# Patient Record
Sex: Male | Born: 1980 | Race: White | Hispanic: No | Marital: Married | Smoking: Current every day smoker
Health system: Southern US, Community
[De-identification: ages and names within clinical notes are randomized; demographics above are authoritative.]

## PROBLEM LIST (undated history)

## (undated) DIAGNOSIS — G43909 Migraine, unspecified, not intractable, without status migrainosus: Secondary | ICD-10-CM

## (undated) DIAGNOSIS — K029 Dental caries, unspecified: Secondary | ICD-10-CM

## (undated) HISTORY — PX: APPENDECTOMY: SHX54

---

## 2001-05-22 ENCOUNTER — Emergency Department (HOSPITAL_COMMUNITY): Admission: EM | Admit: 2001-05-22 | Discharge: 2001-05-22 | Payer: Self-pay | Admitting: Physical Therapy

## 2002-11-08 ENCOUNTER — Emergency Department (HOSPITAL_COMMUNITY): Admission: EM | Admit: 2002-11-08 | Discharge: 2002-11-08 | Payer: Self-pay | Admitting: Emergency Medicine

## 2003-07-02 ENCOUNTER — Emergency Department (HOSPITAL_COMMUNITY): Admission: EM | Admit: 2003-07-02 | Discharge: 2003-07-03 | Payer: Self-pay | Admitting: Emergency Medicine

## 2003-12-12 ENCOUNTER — Emergency Department (HOSPITAL_COMMUNITY): Admission: EM | Admit: 2003-12-12 | Discharge: 2003-12-13 | Payer: Self-pay | Admitting: Emergency Medicine

## 2005-06-19 ENCOUNTER — Emergency Department (HOSPITAL_COMMUNITY): Admission: EM | Admit: 2005-06-19 | Discharge: 2005-06-20 | Payer: Self-pay | Admitting: Emergency Medicine

## 2005-06-19 ENCOUNTER — Emergency Department (HOSPITAL_COMMUNITY): Admission: EM | Admit: 2005-06-19 | Discharge: 2005-06-19 | Payer: Self-pay | Admitting: Emergency Medicine

## 2005-10-21 ENCOUNTER — Emergency Department (HOSPITAL_COMMUNITY): Admission: EM | Admit: 2005-10-21 | Discharge: 2005-10-21 | Payer: Self-pay | Admitting: Emergency Medicine

## 2005-12-08 ENCOUNTER — Emergency Department (HOSPITAL_COMMUNITY): Admission: EM | Admit: 2005-12-08 | Discharge: 2005-12-08 | Payer: Self-pay | Admitting: Emergency Medicine

## 2005-12-24 ENCOUNTER — Emergency Department (HOSPITAL_COMMUNITY): Admission: EM | Admit: 2005-12-24 | Discharge: 2005-12-24 | Payer: Self-pay | Admitting: Emergency Medicine

## 2006-01-18 ENCOUNTER — Emergency Department (HOSPITAL_COMMUNITY): Admission: EM | Admit: 2006-01-18 | Discharge: 2006-01-18 | Payer: Self-pay | Admitting: Emergency Medicine

## 2006-01-25 ENCOUNTER — Ambulatory Visit: Payer: Self-pay | Admitting: Orthopedic Surgery

## 2006-03-01 ENCOUNTER — Ambulatory Visit: Payer: Self-pay | Admitting: Orthopedic Surgery

## 2007-10-23 ENCOUNTER — Emergency Department (HOSPITAL_COMMUNITY): Admission: EM | Admit: 2007-10-23 | Discharge: 2007-10-23 | Payer: Self-pay | Admitting: Emergency Medicine

## 2007-10-25 ENCOUNTER — Emergency Department (HOSPITAL_COMMUNITY): Admission: EM | Admit: 2007-10-25 | Discharge: 2007-10-25 | Payer: Self-pay | Admitting: Emergency Medicine

## 2008-06-04 ENCOUNTER — Emergency Department (HOSPITAL_COMMUNITY): Admission: EM | Admit: 2008-06-04 | Discharge: 2008-06-04 | Payer: Self-pay | Admitting: Emergency Medicine

## 2009-03-10 ENCOUNTER — Emergency Department (HOSPITAL_COMMUNITY): Admission: EM | Admit: 2009-03-10 | Discharge: 2009-03-10 | Payer: Self-pay | Admitting: Emergency Medicine

## 2009-04-28 ENCOUNTER — Emergency Department (HOSPITAL_COMMUNITY): Admission: EM | Admit: 2009-04-28 | Discharge: 2009-04-28 | Payer: Self-pay | Admitting: Emergency Medicine

## 2009-09-29 ENCOUNTER — Emergency Department (HOSPITAL_COMMUNITY): Admission: EM | Admit: 2009-09-29 | Discharge: 2009-09-29 | Payer: Self-pay | Admitting: Emergency Medicine

## 2010-07-21 ENCOUNTER — Emergency Department (HOSPITAL_COMMUNITY): Payer: Self-pay

## 2010-07-21 ENCOUNTER — Emergency Department (HOSPITAL_COMMUNITY)
Admission: EM | Admit: 2010-07-21 | Discharge: 2010-07-21 | Disposition: A | Discharge status: Home / Self Care | Payer: Self-pay | Attending: Emergency Medicine | Admitting: Emergency Medicine

## 2010-07-21 DIAGNOSIS — S60229A Contusion of unspecified hand, initial encounter: Secondary | ICD-10-CM | POA: Insufficient documentation

## 2010-07-21 DIAGNOSIS — M7989 Other specified soft tissue disorders: Secondary | ICD-10-CM | POA: Insufficient documentation

## 2010-07-21 DIAGNOSIS — W208XXA Other cause of strike by thrown, projected or falling object, initial encounter: Secondary | ICD-10-CM | POA: Insufficient documentation

## 2010-11-04 ENCOUNTER — Emergency Department (HOSPITAL_COMMUNITY): Payer: Self-pay

## 2010-11-04 ENCOUNTER — Emergency Department (HOSPITAL_COMMUNITY)
Admission: EM | Admit: 2010-11-04 | Discharge: 2010-11-04 | Disposition: A | Discharge status: Home / Self Care | Payer: Self-pay | Attending: Emergency Medicine | Admitting: Emergency Medicine

## 2010-11-04 DIAGNOSIS — R197 Diarrhea, unspecified: Secondary | ICD-10-CM | POA: Insufficient documentation

## 2010-11-04 DIAGNOSIS — R1013 Epigastric pain: Secondary | ICD-10-CM | POA: Insufficient documentation

## 2010-11-04 DIAGNOSIS — E860 Dehydration: Secondary | ICD-10-CM | POA: Insufficient documentation

## 2010-11-04 DIAGNOSIS — R112 Nausea with vomiting, unspecified: Secondary | ICD-10-CM | POA: Insufficient documentation

## 2010-11-04 DIAGNOSIS — R109 Unspecified abdominal pain: Secondary | ICD-10-CM | POA: Insufficient documentation

## 2010-11-04 DIAGNOSIS — R1033 Periumbilical pain: Secondary | ICD-10-CM | POA: Insufficient documentation

## 2010-11-04 LAB — URINALYSIS, ROUTINE W REFLEX MICROSCOPIC
Bilirubin Urine: NEGATIVE
Glucose, UA: NEGATIVE mg/dL
Hgb urine dipstick: NEGATIVE
Specific Gravity, Urine: 1.034 — ABNORMAL HIGH (ref 1.005–1.030)
Urobilinogen, UA: 0.2 mg/dL (ref 0.0–1.0)
pH: 6.5 (ref 5.0–8.0)

## 2010-11-04 LAB — COMPREHENSIVE METABOLIC PANEL
AST: 44 U/L — ABNORMAL HIGH (ref 0–37)
Alkaline Phosphatase: 93 U/L (ref 39–117)
CO2: 32 mEq/L (ref 19–32)
Calcium: 10.6 mg/dL — ABNORMAL HIGH (ref 8.4–10.5)
GFR calc non Af Amer: >60 mL/min (ref >60)
Glucose, Bld: 125 mg/dL — ABNORMAL HIGH (ref 70–99)
Total Bilirubin: 0.9 mg/dL (ref 0.3–1.2)
Total Protein: 9.5 g/dL — ABNORMAL HIGH (ref 6.0–8.3)

## 2010-11-04 LAB — LIPASE, BLOOD: Lipase: 40 U/L (ref 11–59)

## 2010-11-04 LAB — DIFFERENTIAL
Basophils Absolute: 0 10*3/uL (ref 0.0–0.1)
Basophils Relative: 0 % (ref 0–1)
Eosinophils Absolute: 0 10*3/uL (ref 0.0–0.7)
Eosinophils Relative: 0 % (ref 0–5)
Lymphs Abs: 2.2 10*3/uL (ref 0.7–4.0)
Monocytes Absolute: 1.3 10*3/uL — ABNORMAL HIGH (ref 0.1–1.0)

## 2010-11-04 LAB — CBC
HCT: 42.1 % (ref 39.0–52.0)
Hemoglobin: 14.5 g/dL (ref 13.0–17.0)
MCHC: 34.4 g/dL (ref 30.0–36.0)
MCV: 91.7 fL (ref 78.0–100.0)
RBC: 4.59 MIL/uL (ref 4.22–5.81)
WBC: 16.4 10*3/uL — ABNORMAL HIGH (ref 4.0–10.5)

## 2010-11-04 LAB — URINE MICROSCOPIC-ADD ON

## 2010-11-05 ENCOUNTER — Emergency Department (HOSPITAL_COMMUNITY): Payer: Self-pay

## 2010-11-05 ENCOUNTER — Emergency Department (HOSPITAL_COMMUNITY)
Admission: EM | Admit: 2010-11-05 | Discharge: 2010-11-06 | Disposition: A | Discharge status: Home / Self Care | Payer: Self-pay | Attending: Emergency Medicine | Admitting: Emergency Medicine

## 2010-11-05 DIAGNOSIS — R64 Cachexia: Secondary | ICD-10-CM | POA: Insufficient documentation

## 2010-11-05 DIAGNOSIS — R112 Nausea with vomiting, unspecified: Secondary | ICD-10-CM | POA: Insufficient documentation

## 2010-11-05 DIAGNOSIS — E876 Hypokalemia: Secondary | ICD-10-CM | POA: Insufficient documentation

## 2010-11-05 DIAGNOSIS — R10819 Abdominal tenderness, unspecified site: Secondary | ICD-10-CM | POA: Insufficient documentation

## 2010-11-05 DIAGNOSIS — F29 Unspecified psychosis not due to a substance or known physiological condition: Secondary | ICD-10-CM | POA: Insufficient documentation

## 2010-11-05 DIAGNOSIS — R062 Wheezing: Secondary | ICD-10-CM | POA: Insufficient documentation

## 2010-11-05 DIAGNOSIS — R197 Diarrhea, unspecified: Secondary | ICD-10-CM | POA: Insufficient documentation

## 2010-11-05 DIAGNOSIS — R109 Unspecified abdominal pain: Secondary | ICD-10-CM | POA: Insufficient documentation

## 2010-11-05 DIAGNOSIS — T426X5A Adverse effect of other antiepileptic and sedative-hypnotic drugs, initial encounter: Secondary | ICD-10-CM | POA: Insufficient documentation

## 2010-11-05 LAB — URINALYSIS, ROUTINE W REFLEX MICROSCOPIC
Glucose, UA: NEGATIVE mg/dL
Ketones, ur: 40 mg/dL — AB
Specific Gravity, Urine: 1.035 — ABNORMAL HIGH (ref 1.005–1.030)

## 2010-11-05 LAB — DIFFERENTIAL
Basophils Absolute: 0 10*3/uL (ref 0.0–0.1)
Basophils Relative: 0 % (ref 0–1)
Eosinophils Absolute: 0 10*3/uL (ref 0.0–0.7)
Lymphs Abs: 3.1 10*3/uL (ref 0.7–4.0)
Monocytes Absolute: 1.2 10*3/uL — ABNORMAL HIGH (ref 0.1–1.0)
Neutro Abs: 9.2 10*3/uL — ABNORMAL HIGH (ref 1.7–7.7)

## 2010-11-05 LAB — COMPREHENSIVE METABOLIC PANEL
ALT: 26 U/L (ref 0–53)
AST: 31 U/L (ref 0–37)
Albumin: 4.3 g/dL (ref 3.5–5.2)
CO2: 28 mEq/L (ref 19–32)
Calcium: 9.5 mg/dL (ref 8.4–10.5)
Creatinine, Ser: 0.54 mg/dL (ref 0.50–1.35)
GFR calc non Af Amer: >60 mL/min (ref >60)
Potassium: 3.1 mEq/L — ABNORMAL LOW (ref 3.5–5.1)
Total Bilirubin: 0.9 mg/dL (ref 0.3–1.2)
Total Protein: 7.7 g/dL (ref 6.0–8.3)

## 2010-11-05 LAB — CBC
MCV: 91.5 fL (ref 78.0–100.0)
RBC: 4.7 MIL/uL (ref 4.22–5.81)
RDW: 12.3 % (ref 11.5–15.5)
WBC: 13.5 10*3/uL — ABNORMAL HIGH (ref 4.0–10.5)

## 2010-11-05 LAB — URINE MICROSCOPIC-ADD ON

## 2010-11-05 LAB — RAPID URINE DRUG SCREEN, HOSP PERFORMED
Amphetamines: NOT DETECTED
Barbiturates: NOT DETECTED
Opiates: NOT DETECTED
Tetrahydrocannabinol: POSITIVE — AB

## 2010-11-05 LAB — ETHANOL: Alcohol, Ethyl (B): <11 mg/dL (ref 0–11)

## 2010-11-05 MED ORDER — IOHEXOL 300 MG/ML  SOLN
100.0000 mL | Freq: Once | INTRAMUSCULAR | Status: AC | PRN
  Administered 2010-11-05: 100 mL via INTRAVENOUS

## 2011-01-14 ENCOUNTER — Emergency Department (HOSPITAL_COMMUNITY)
Admission: EM | Admit: 2011-01-14 | Discharge: 2011-01-14 | Disposition: A | Discharge status: Home / Self Care | Payer: Self-pay | Attending: Emergency Medicine | Admitting: Emergency Medicine

## 2011-01-14 ENCOUNTER — Emergency Department (HOSPITAL_COMMUNITY): Payer: Self-pay

## 2011-01-14 DIAGNOSIS — IMO0002 Reserved for concepts with insufficient information to code with codable children: Secondary | ICD-10-CM | POA: Insufficient documentation

## 2011-01-14 DIAGNOSIS — R569 Unspecified convulsions: Secondary | ICD-10-CM | POA: Insufficient documentation

## 2011-01-14 DIAGNOSIS — W1809XA Striking against other object with subsequent fall, initial encounter: Secondary | ICD-10-CM | POA: Insufficient documentation

## 2011-01-14 DIAGNOSIS — F29 Unspecified psychosis not due to a substance or known physiological condition: Secondary | ICD-10-CM | POA: Insufficient documentation

## 2011-01-14 LAB — COMPREHENSIVE METABOLIC PANEL
BUN: 11 mg/dL (ref 6–23)
CO2: 22 mEq/L (ref 19–32)
Calcium: 9.2 mg/dL (ref 8.4–10.5)
Creatinine, Ser: 0.6 mg/dL (ref 0.50–1.35)
GFR calc Af Amer: >90 mL/min (ref >90)
GFR calc non Af Amer: >90 mL/min (ref >90)
Glucose, Bld: 108 mg/dL — ABNORMAL HIGH (ref 70–99)

## 2011-01-14 LAB — CBC
Hemoglobin: 14.3 g/dL (ref 13.0–17.0)
MCH: 33 pg (ref 26.0–34.0)
Platelets: 179 10*3/uL (ref 150–400)
WBC: 16.6 10*3/uL — ABNORMAL HIGH (ref 4.0–10.5)

## 2011-01-14 LAB — RAPID URINE DRUG SCREEN, HOSP PERFORMED
Cocaine: NOT DETECTED
Opiates: POSITIVE — AB

## 2011-01-14 LAB — DIFFERENTIAL
Basophils Absolute: 0 10*3/uL (ref 0.0–0.1)
Basophils Relative: 0 % (ref 0–1)
Eosinophils Relative: 0 % (ref 0–5)
Lymphocytes Relative: 13 % (ref 12–46)
Monocytes Absolute: 1 10*3/uL (ref 0.1–1.0)
Neutro Abs: 13.4 10*3/uL — ABNORMAL HIGH (ref 1.7–7.7)

## 2011-01-14 LAB — ETHANOL: Alcohol, Ethyl (B): <11 mg/dL (ref 0–11)

## 2011-07-21 ENCOUNTER — Encounter (HOSPITAL_COMMUNITY): Payer: Self-pay | Admitting: Emergency Medicine

## 2011-07-21 ENCOUNTER — Emergency Department (HOSPITAL_COMMUNITY)
Admission: EM | Admit: 2011-07-21 | Discharge: 2011-07-21 | Disposition: A | Discharge status: Home / Self Care | Payer: Self-pay | Attending: Emergency Medicine | Admitting: Emergency Medicine

## 2011-07-21 DIAGNOSIS — F17200 Nicotine dependence, unspecified, uncomplicated: Secondary | ICD-10-CM | POA: Insufficient documentation

## 2011-07-21 DIAGNOSIS — K0889 Other specified disorders of teeth and supporting structures: Secondary | ICD-10-CM

## 2011-07-21 DIAGNOSIS — K029 Dental caries, unspecified: Secondary | ICD-10-CM | POA: Insufficient documentation

## 2011-07-21 MED ORDER — PENICILLIN V POTASSIUM 500 MG PO TABS: 500.0000 mg | ORAL_TABLET | Freq: Three times a day (TID) | ORAL | Status: AC

## 2011-07-21 MED ORDER — HYDROCODONE-ACETAMINOPHEN 5-325 MG PO TABS: ORAL_TABLET | ORAL | Status: AC

## 2011-07-21 MED ORDER — NAPROXEN 500 MG PO TABS: 500.0000 mg | ORAL_TABLET | Freq: Two times a day (BID) | ORAL | Status: DC

## 2011-07-21 NOTE — ED Provider Notes (Signed)
History     CSN: [id number]  Arrival date & time 07/21/11  4098   First MD Initiated Contact with Patient 07/21/11 [phone]      Chief Complaint  Patient presents with  . Dental Pain  . Dental Problem    (Consider location/radiation/quality/duration/timing/severity/associated sxs/prior treatment) HPI Comments: 31 yo male presents to the ER this morning for tooth pain in the area of the R mandibular bicuspid.  Patient has a long history of dental problems and states that this time he noticed swelling and pain along the right gumline between his teeth and cheek for the last few weeks.  A few days ago the swelling came to a white head so the patient drained the area of pus.  The swelling and pain have continued.  He has had multiple broken teeth in the area, but denies recent injury or loss of teeth in that area.  Does not have a dentist and has yet to try anything alleviate the problem. Denies fever, headache, n/v, facial/throat swelling or redness. No neck swelling or trouble breathing.   Patient is a 31 y.o. male presenting with tooth pain. The history is provided by the patient.  Dental PainThe primary symptoms include mouth pain. Primary symptoms do not include dental injury, headaches, fever, shortness of breath or sore throat. The symptoms began more than 1 week ago. The symptoms are unchanged. The symptoms are chronic.  Additional symptoms include: gum swelling and gum tenderness. Additional symptoms do not include: facial swelling, trouble swallowing, ear pain and swollen glands. Medical issues include: smoking and periodontal disease. Medical issues do not include: chewing tobacco and immunosuppression.    History reviewed. No pertinent past medical history.  Past Surgical History  Procedure Date  . Appendectomy     History reviewed. No pertinent family history.  History  Substance Use Topics  . Smoking status: Current Everyday Smoker    Types: Cigarettes  . Smokeless tobacco:  Not on file  . Alcohol Use: No      Review of Systems  Constitutional: Negative for fever.  HENT: Positive for dental problem. Negative for ear pain, sore throat, facial swelling, trouble swallowing and neck pain.   Respiratory: Negative for shortness of breath and stridor.   Skin: Negative for color change.  Neurological: Negative for headaches.    Allergies  Phenergan  Home Medications  No current outpatient prescriptions on file.  BP 130/74  Pulse 71  Temp(Src) 98.7 F (37.1 C) (Oral)  Ht 5\' 6"  (1.676 m)  Wt 110 lb (49.896 kg)  BMI 17.75 kg/m2  SpO2 100%  Physical Exam  Nursing note and vitals reviewed. Constitutional: He is oriented to person, place, and time. He appears well-developed and well-nourished.  HENT:  Head: Normocephalic and atraumatic. No trismus in the jaw.  Right Ear: Tympanic membrane, external ear and ear canal normal.  Left Ear: Tympanic membrane, external ear and ear canal normal.  Nose: Nose normal.  Mouth/Throat: Uvula is midline, oropharynx is clear and moist and mucous membranes are normal. Abnormal dentition. Dental caries present. No dental abscesses or uvula swelling. No tonsillar abscesses.       Patient with R mandibular tooth pain and tenderness to palpation in area of bicuspid. No swelling or erythema noted on exam.  Eyes: Pupils are equal, round, and reactive to light.  Neck: Normal range of motion. Neck supple.       No neck swelling or Lugwig's angina  Cardiovascular: Normal rate, regular rhythm and normal heart sounds.  Pulmonary/Chest: Effort normal and breath sounds normal. No respiratory distress.  Lymphadenopathy:    He has no cervical adenopathy.  Neurological: He is alert and oriented to person, place, and time.  Skin: Skin is warm and dry.  Psychiatric: He has a normal mood and affect.    ED Course  Procedures (including critical care time)  Labs Reviewed - No data to display No results found.   1. Pain, dental       6:46 AM Patient seen and examined.    Vital signs reviewed and are as follows: Filed Vitals:   07/21/11 0338  BP: 130/74  Pulse: 71  Temp: 98.7 F (37.1 C)   Patient counseled to take prescribed medications as directed, return with worsening facial or neck swelling, and to follow-up with his dentist as soon as possible.   Patient counseled on use of narcotic pain medications. Counseled not to combine these medications with others containing tylenol. Urged not to drink alcohol, drive, or perform any other activities that requires focus while taking these medications. The patient verbalizes understanding and agrees with the plan.  MDM  Patient with toothache.  No gross abscess.  Exam unconcerning for Ludwig's angina or other deep tissue infection in neck.  Will treat with penicillin and pain medicine.  Urged patient to follow-up with dentist.           Renne Crigler, PA 07/21/11 [phone]

## 2011-07-21 NOTE — ED Notes (Signed)
Pt presented to the ER with c/ojaw pain secondary to dental issue, pt rerpots that his teeth are with decay and that he noted puss from the gums when pushed around, to the right side, bottom.

## 2011-07-21 NOTE — Discharge Instructions (Signed)
Please read and follow all provided instructions.  Your diagnoses today include:  1. Pain, dental     Tests performed today include:  Vital signs. See below for your results today.   Medications prescribed:   Vicodin (hydrocodone/acetaminophen) - narcotic pain medication  You have been prescribed narcotic pain medication such as Vicodin, Percocet, or Ultram: DO NOT drive or perform any activities that require you to be awake and alert because this medicine can make you drowsy. BE VERY CAREFUL not to take multiple medicines containing Tylenol (also called acetaminophen). Doing so can lead to an overdose which can damage your liver and cause liver failure and possibly death.    Naproxen - anti-inflammatory pain medication  Do not exceed 500mg  naproxen every 12 hours  You have been prescribed an anti-inflammatory medication or NSAID. Take with food. Take smallest effective dose for the shortest duration needed for your pain. Stop taking if you experience stomach pain or vomiting.    Penicillin - antibiotic for dental infection  You have been prescribed an antibiotic medicine: take the entire course of medicine even if you are feeling better. Stopping early can cause the antibiotic not to work.  Take any prescribed medications only as directed.  Home care instructions:  Follow any educational materials contained in this packet.  Follow-up instructions: Please follow-up with your dentist for further evaluation of your symptoms. Call the referral below for an appointment. Tell them you were seen in the emergency department.  If you do not have a dentist or primary care doctor -- see below for referral information.   The exam and treatment you received today has been provided on an emergency basis only. This is not a substitute for complete medical or dental care. If your problem worsens or new symptoms (problems) appear, and you are unable to arrange prompt follow-up care with your  dentist, return to this location.  Return instructions:   Please return to the Emergency Department if you experience worsening symptoms.  Please return if you develop a fever, you develop more swelling in your face or neck, you have trouble breathing or swallowing food.  Please return if you have any other emergent concerns.  Additional Information:  Your vital signs today were: BP 130/74  Pulse 71  Temp(Src) 98.7 F (37.1 C) (Oral)  Ht 5\' 6"  (1.676 m)  Wt 110 lb (49.896 kg)  BMI 17.75 kg/m2  SpO2 100% If your blood pressure (BP) was elevated above 135/85 this visit, please have this repeated by your doctor within one month. -------------- No Primary Care Doctor Call [facility]  [phone] Other agencies that provide inexpensive medical care    [facility]  [phone]    [facility]  [phone]    [facility]  [phone]    [facility]  [phone]    [facility]  [phone]    [facility]  [phone] -------------- RESOURCE GUIDE:  Dental Problems  Patients with Medicaid: [facility] [phone] [address]                                            [phone] [address] Phone:  [phone]  Phone:  [phone]  If unable to pay or uninsured, contact:  [facility] or [facility] to become qualified for the adult dental clinic.  Chronic Pain Problems Contact [facility]  [phone] Patients need to be referred by their primary care doctor.  Insufficient Money for Medicine Contact [employer]:  call "211" or [facility] 8136184987.  Psychological Services [facility]  [phone] [facility]  [phone] [facility]   [phone] (emergency services [phone])  Substance Abuse Resources [facility]  2497487519 [facility] [phone] [facility] [place] [phone] Floydene Flock [phone] Residential & Outpatient Substance Abuse Program  [phone]  Abuse/Neglect [place] Child Abuse Hotline (913)231-6650 [facility] Child Abuse Hotline 9516084707 (After Hours)  Emergency Shelter [facility] [phone]  Maternity Homes [facility] at [facility] [place] [facility] [phone] [place] [facility] [phone]  [facility] Resources  [facility]     [employer]                          [facility] Dept. [address]                        [address]      [address]  Blondell Reveal Phone:  [phone]                                   Phone:  [phone]                 Phone:  [phone]  [facility] Phone:  [phone]  [facility] Child Abuse Hotline [phone] 2026544740 (After Hours)

## 2011-07-23 NOTE — ED Provider Notes (Signed)
Medical screening examination/treatment/procedure(s) were performed by non-physician practitioner and as supervising physician I was immediately available for consultation/collaboration.  Lenda Baratta M Jeslie Lowe, MD 07/23/11 2139 

## 2011-10-21 ENCOUNTER — Emergency Department (HOSPITAL_COMMUNITY)
Admission: EM | Admit: 2011-10-21 | Discharge: 2011-10-21 | Disposition: A | Discharge status: Home / Self Care | Payer: Self-pay | Attending: Emergency Medicine | Admitting: Emergency Medicine

## 2011-10-21 ENCOUNTER — Encounter (HOSPITAL_COMMUNITY): Payer: Self-pay | Admitting: Emergency Medicine

## 2011-10-21 DIAGNOSIS — K029 Dental caries, unspecified: Secondary | ICD-10-CM | POA: Insufficient documentation

## 2011-10-21 DIAGNOSIS — F17200 Nicotine dependence, unspecified, uncomplicated: Secondary | ICD-10-CM | POA: Insufficient documentation

## 2011-10-21 DIAGNOSIS — K089 Disorder of teeth and supporting structures, unspecified: Secondary | ICD-10-CM | POA: Insufficient documentation

## 2011-10-21 DIAGNOSIS — K0889 Other specified disorders of teeth and supporting structures: Secondary | ICD-10-CM

## 2011-10-21 MED ORDER — NAPROXEN 500 MG PO TABS: 500.0000 mg | ORAL_TABLET | Freq: Two times a day (BID) | ORAL | Status: DC

## 2011-10-21 MED ORDER — HYDROCODONE-ACETAMINOPHEN 5-325 MG PO TABS: 2.0000 | ORAL_TABLET | ORAL | Status: AC | PRN

## 2011-10-21 MED ORDER — PENICILLIN V POTASSIUM 500 MG PO TABS: 500.0000 mg | ORAL_TABLET | Freq: Four times a day (QID) | ORAL | Status: AC

## 2011-10-21 NOTE — ED Notes (Signed)
Pt verbalizes understanding 

## 2011-10-21 NOTE — ED Notes (Signed)
Presents w/ painful left lower jaw, ear pain, and headache. Obvious multiple dental caries entire oral cavity

## 2011-10-21 NOTE — ED Provider Notes (Signed)
History     CSN: [id number]  Arrival date & time 10/21/11  1619   First MD Initiated Contact with Patient 10/21/11 1932      Chief Complaint  Patient presents with  . Dental Pain    c/o pain left lower jaw line, all dentation w/ obvious caries    (Consider location/radiation/quality/duration/timing/severity/associated sxs/prior treatment) Patient is a 31 y.o. male presenting with tooth pain. The history is provided by the patient. No language interpreter was used.  Dental PainThe primary symptoms include mouth pain. Primary symptoms do not include fever, sore throat or angioedema. The symptoms began more than 1 month ago. The symptoms are worsening. The symptoms are recurrent. The symptoms occur intermittently.  Additional symptoms include: dental sensitivity to temperature, gum swelling, gum tenderness, jaw pain and ear pain. Additional symptoms do not include: purulent gums, trismus, facial swelling, trouble swallowing, drooling, hearing loss and swollen glands. Medical issues include: smoking. Medical issues do not include: alcohol problem.   Patient was here  April 1 with the same complaint that his right lower incisor tooth pain with inflamed gums and swelling. States That he cannot afford to have the tooth that has broken off under his gum removed by an oral Careers adviser. He has 2 other decayed teeth on either side. Pain 10 out of 10. States he has  taken Tylenol for pain with no relief.    History reviewed. No pertinent past medical history.  Past Surgical History  Procedure Date  . Appendectomy     No family history on file.  History  Substance Use Topics  . Smoking status: Current Everyday Smoker -- 1.0 packs/day    Types: Cigarettes  . Smokeless tobacco: Never Used  . Alcohol Use: No      Review of Systems  Constitutional: Negative.  Negative for fever.  HENT: Positive for ear pain and dental problem. Negative for hearing loss, sore throat, facial swelling, drooling,  trouble swallowing, neck pain and ear discharge.   Eyes: Negative.   Respiratory: Negative.   Cardiovascular: Negative.   Gastrointestinal: Negative.   Neurological: Negative.   Psychiatric/Behavioral: Negative.   All other systems reviewed and are negative.    Allergies  Promethazine hcl  Home Medications   Current Outpatient Rx  Name Route Sig Dispense Refill  . NAPROXEN 500 MG PO TABS Oral Take 1 tablet (500 mg total) by mouth 2 (two) times daily. 20 tablet 0    BP 114/78  Pulse 67  Temp 98 F (36.7 C) (Oral)  SpO2 100%  Physical Exam  Nursing note and vitals reviewed. Constitutional: He is oriented to person, place, and time. He appears well-developed and well-nourished.  HENT:  Head: Normocephalic.  Mouth/Throat:    Eyes: Conjunctivae and EOM are normal. Pupils are equal, round, and reactive to light.  Neck: Normal range of motion. Neck supple.  Cardiovascular: Normal rate.   Pulmonary/Chest: Effort normal.  Abdominal: Soft.  Musculoskeletal: Normal range of motion.  Neurological: He is alert and oriented to person, place, and time.  Skin: Skin is warm and dry.  Psychiatric: He has a normal mood and affect.    ED Course  Procedures (including critical care time)  Labs Reviewed - No data to display No results found.   No diagnosis found.    MDM  Tooth pain with multiple caries.  Will call dentist on Monday for appointment.  Dentist will recommend a oral surgeon for 1 of the teeth.  Rx for naprosyn, pcn, and vicodin  Remi Haggard, NP 10/22/11 1401

## 2011-10-21 NOTE — ED Notes (Signed)
Pt reports right lower back tooth "infection"  Pt reports tooth is broken in gums

## 2011-10-25 NOTE — ED Provider Notes (Signed)
History/physical exam/procedure(s) were performed by non-physician practitioner and as supervising physician I was immediately available for consultation/collaboration. I have reviewed all notes and am in agreement with care and plan.   Gable Odonohue S Sanela Evola, MD 10/25/11 0757 

## 2012-04-15 ENCOUNTER — Emergency Department (HOSPITAL_COMMUNITY)
Admission: EM | Admit: 2012-04-15 | Discharge: 2012-04-15 | Disposition: A | Discharge status: Home / Self Care | Payer: Self-pay | Attending: Emergency Medicine | Admitting: Emergency Medicine

## 2012-04-15 ENCOUNTER — Encounter (HOSPITAL_COMMUNITY): Payer: Self-pay | Admitting: *Deleted

## 2012-04-15 DIAGNOSIS — F17200 Nicotine dependence, unspecified, uncomplicated: Secondary | ICD-10-CM | POA: Insufficient documentation

## 2012-04-15 DIAGNOSIS — K089 Disorder of teeth and supporting structures, unspecified: Secondary | ICD-10-CM | POA: Insufficient documentation

## 2012-04-15 DIAGNOSIS — K029 Dental caries, unspecified: Secondary | ICD-10-CM | POA: Insufficient documentation

## 2012-04-15 DIAGNOSIS — K0889 Other specified disorders of teeth and supporting structures: Secondary | ICD-10-CM

## 2012-04-15 MED ORDER — HYDROCODONE-ACETAMINOPHEN 5-325 MG PO TABS: 1.0000 | ORAL_TABLET | Freq: Four times a day (QID) | ORAL | Status: DC | PRN

## 2012-04-15 NOTE — ED Provider Notes (Signed)
History     CSN: [id number]  Arrival date & time 04/15/12  2952   First MD Initiated Contact with Patient 04/15/12 1044      Chief Complaint  Patient presents with  . Dental Pain    (Consider location/radiation/quality/duration/timing/severity/associated sxs/prior treatment) HPI Comments: 32 yo male presents to the ED complaining of dental pain.  Patient states that the pain in his upper left molar began this morning. The tooth intermittently bothers him normally, but it is worst he has ever experienced today.  Pain is sharp, shooting, and is radiating to the ear and head rated 9/10.  Pain is exacerbated by eating and drinking, and is slightly relieved by orajel and tylenol.  Patient does not see a dentist.  Patient denies fever, chills, and swelling of the face and jaw.     The history is provided by the patient. No language interpreter was used.    History reviewed. No pertinent past medical history.  Past Surgical History  Procedure Date  . Appendectomy     No family history on file.  History  Substance Use Topics  . Smoking status: Current Every Day Smoker -- 1.0 packs/day    Types: Cigarettes  . Smokeless tobacco: Never Used  . Alcohol Use: No      Review of Systems  Constitutional: Negative for fever and chills.  HENT: Positive for dental problem. Negative for ear pain, congestion, facial swelling, neck pain and ear discharge.   Respiratory: Negative for shortness of breath.   Cardiovascular: Negative for chest pain.  All other systems reviewed and are negative.    Allergies  Promethazine hcl  Home Medications   Current Outpatient Rx  Name  Route  Sig  Dispense  Refill  . NAPROXEN 500 MG PO TABS   Oral   Take 1 tablet (500 mg total) by mouth 2 (two) times daily.   20 tablet   0   . NAPROXEN 500 MG PO TABS   Oral   Take 1 tablet (500 mg total) by mouth 2 (two) times daily.   30 tablet   0     BP 149/70  Pulse 92  Temp 97.9 F (36.6 C)  (Oral)  Resp 16  Ht 5\' 6"  (1.676 m)  Wt 110 lb (49.896 kg)  BMI 17.75 kg/m2  SpO2 100%  Physical Exam  Nursing note and vitals reviewed. Constitutional: He is oriented to person, place, and time. He appears well-developed and well-nourished. No distress.  HENT:  Head: Normocephalic and atraumatic.  Right Ear: External ear normal.  Left Ear: External ear normal.  Nose: Nose normal.  Mouth/Throat: Uvula is midline, oropharynx is clear and moist and mucous membranes are normal. Abnormal dentition. Dental caries present. No oropharyngeal exudate, posterior oropharyngeal edema, posterior oropharyngeal erythema or tonsillar abscesses.       Poor dentition throughout, multiple eroded and missing teeth.  No evidence of dental abscess, no erythema, or swelling of the gums.  Generalized tenderness of the mouth.  Eyes: Conjunctivae normal are normal. Pupils are equal, round, and reactive to light.  Neck: Normal range of motion. Neck supple.  Cardiovascular: Normal rate, regular rhythm and normal heart sounds.  Exam reveals no gallop and no friction rub.   No murmur heard. Pulmonary/Chest: Effort normal and breath sounds normal. No respiratory distress. He has no wheezes. He has no rales. He exhibits no tenderness.  Musculoskeletal: Normal range of motion. He exhibits no edema.  Neurological: He is alert and oriented to person,  place, and time.  Skin: Skin is warm and dry. He is not diaphoretic.  Psychiatric: He has a normal mood and affect. His behavior is normal.    ED Course  Procedures (including critical care time)  Labs Reviewed - No data to display No results found.   1. Pain, dental   2. Poor dentition       MDM  32 y/o male with dental pain. Poor dentition noted throughout. No evidence of infection or abscess. Number for dentist given. Stressed importance of follow up with dentist. Rx vicodin #10. Return precautions discussed. Vitals normal. Patient states understanding of plan  and is agreeable.         Trevor Mace, PA-C 04/15/12 1159

## 2012-04-15 NOTE — Progress Notes (Signed)
  On 04/15/12 Pt listed with no insurance coverage and no pcp CM and [facility] liaison spoke with pt. Pt offered services to assist with finding a [place] self pay provider & health reform information

## 2012-04-15 NOTE — ED Notes (Signed)
Pt reports a L lower toothache-became severe this am.  Pt reports a broken tooth with "a hole in the middle."  No dentist.

## 2012-04-15 NOTE — Progress Notes (Signed)
Pt agreed to see Dr Edward Jolly or contact toll free number for dental providers offered by [facility] liaison

## 2012-04-18 NOTE — ED Provider Notes (Signed)
Medical screening examination/treatment/procedure(s) were performed by non-physician practitioner and as supervising physician I was immediately available for consultation/collaboration.  [name], MD 04/18/12 0753 

## 2012-08-13 ENCOUNTER — Emergency Department (HOSPITAL_COMMUNITY): Payer: Self-pay

## 2012-08-13 ENCOUNTER — Emergency Department (HOSPITAL_COMMUNITY)
Admission: EM | Admit: 2012-08-13 | Discharge: 2012-08-14 | Disposition: A | Discharge status: Home / Self Care | Payer: Self-pay | Attending: Emergency Medicine | Admitting: Emergency Medicine

## 2012-08-13 ENCOUNTER — Encounter (HOSPITAL_COMMUNITY): Payer: Self-pay | Admitting: Emergency Medicine

## 2012-08-13 DIAGNOSIS — S20212A Contusion of left front wall of thorax, initial encounter: Secondary | ICD-10-CM

## 2012-08-13 DIAGNOSIS — W010XXA Fall on same level from slipping, tripping and stumbling without subsequent striking against object, initial encounter: Secondary | ICD-10-CM | POA: Insufficient documentation

## 2012-08-13 DIAGNOSIS — Z87891 Personal history of nicotine dependence: Secondary | ICD-10-CM | POA: Insufficient documentation

## 2012-08-13 DIAGNOSIS — IMO0002 Reserved for concepts with insufficient information to code with codable children: Secondary | ICD-10-CM | POA: Insufficient documentation

## 2012-08-13 DIAGNOSIS — Y92009 Unspecified place in unspecified non-institutional (private) residence as the place of occurrence of the external cause: Secondary | ICD-10-CM | POA: Insufficient documentation

## 2012-08-13 DIAGNOSIS — Y9389 Activity, other specified: Secondary | ICD-10-CM | POA: Insufficient documentation

## 2012-08-13 DIAGNOSIS — S20219A Contusion of unspecified front wall of thorax, initial encounter: Secondary | ICD-10-CM | POA: Insufficient documentation

## 2012-08-13 MED ORDER — IBUPROFEN 800 MG PO TABS
800.0000 mg | ORAL_TABLET | Freq: Once | ORAL | Status: AC
  Administered 2012-08-14: 800 mg via ORAL
  Filled 2012-08-13: qty 1

## 2012-08-13 NOTE — ED Notes (Signed)
Pt states he was taking something out to his car and slipped and fell landing on the concrete on his left side  Pt states he is having pain to his rib area on that side  Pt is guarding his ribs

## 2012-08-14 MED ORDER — HYDROCODONE-ACETAMINOPHEN 5-325 MG PO TABS: 1.0000 | ORAL_TABLET | Freq: Four times a day (QID) | ORAL | Status: DC | PRN

## 2012-08-14 MED ORDER — HYDROMORPHONE HCL PF 1 MG/ML IJ SOLN
1.0000 mg | Freq: Once | INTRAMUSCULAR | Status: AC
  Administered 2012-08-14: 1 mg via INTRAMUSCULAR
  Filled 2012-08-14: qty 1

## 2012-08-14 MED ORDER — IBUPROFEN 800 MG PO TABS: 800.0000 mg | ORAL_TABLET | Freq: Three times a day (TID) | ORAL | Status: DC

## 2012-08-14 NOTE — ED Provider Notes (Signed)
History     CSN: [id number]  Arrival date & time 08/13/12  2317   First MD Initiated Contact with Patient 08/13/12 2355      Chief Complaint  Patient presents with  . Fall    (Consider location/radiation/quality/duration/timing/severity/associated sxs/prior treatment) HPI Hx per PT - around 9pm slipped at home outside, injured left ribs, sustained abrasion, denies any other pain/ injury/ trauma. Pain is sharp worse with deep inspiration. No ABD pain, no SOB otherwise. Pain mod to severe.  History reviewed. No pertinent past medical history.  Past Surgical History  Procedure Laterality Date  . Appendectomy      History reviewed. No pertinent family history.  History  Substance Use Topics  . Smoking status: Former Smoker -- 1.00 packs/day    Types: Cigarettes  . Smokeless tobacco: Never Used  . Alcohol Use: No      Review of Systems  Constitutional: Negative for fever and chills.  HENT: Negative for neck pain and neck stiffness.   Eyes: Negative for pain.  Respiratory: Negative for shortness of breath.   Cardiovascular: Positive for chest pain.  Gastrointestinal: Negative for vomiting and abdominal pain.  Genitourinary: Negative for dysuria.  Musculoskeletal: Negative for back pain.  Skin: Negative for rash.  Neurological: Negative for headaches.  All other systems reviewed and are negative.    Allergies  Promethazine hcl  Home Medications   Current Outpatient Rx  Name  Route  Sig  Dispense  Refill  . acetaminophen (TYLENOL) 500 MG tablet   Oral   Take 500 mg by mouth every 6 (six) hours as needed. pain         . HYDROcodone-acetaminophen (NORCO/VICODIN) 5-325 MG per tablet   Oral   Take 1-2 tablets by mouth every 6 (six) hours as needed for pain.   10 tablet   0   . HYDROcodone-acetaminophen (NORCO/VICODIN) 5-325 MG per tablet   Oral   Take 1 tablet by mouth every 6 (six) hours as needed for pain.   10 tablet   0   . ibuprofen (ADVIL,MOTRIN)  800 MG tablet   Oral   Take 1 tablet (800 mg total) by mouth 3 (three) times daily.   21 tablet   0     BP 123/67  Pulse 96  Temp(Src) 99.3 F (37.4 C) (Oral)  Resp 22  Ht 5\' 6"  (1.676 m)  Wt 110 lb (49.896 kg)  BMI 17.76 kg/m2  SpO2 98%  Physical Exam  Constitutional: He is oriented to person, place, and time. He appears well-developed and well-nourished.  HENT:  Head: Normocephalic and atraumatic.  Eyes: EOM are normal. Pupils are equal, round, and reactive to light.  Neck: Neck supple.  Cardiovascular: Normal rate, regular rhythm and intact distal pulses.   Pulmonary/Chest: Effort normal and breath sounds normal. No respiratory distress.  TTP left lateral ribs with abrasions, no crepitus. No LUQ tenderness or guarding  Abdominal: Soft. Bowel sounds are normal. He exhibits no distension. There is no tenderness. There is no rebound and no guarding.  Musculoskeletal: Normal range of motion. He exhibits no edema.  Neurological: He is alert and oriented to person, place, and time.  Skin: Skin is warm and dry.    ED Course  Procedures (including critical care time)  Labs Reviewed - No data to display Dg Ribs Unilateral W/chest Left  08/13/2012  *RADIOLOGY REPORT*  Clinical Data: Fall, left lateral rib pain.  LEFT RIBS AND CHEST - 3+ VIEW  Comparison: 11/04/2010  Findings:  Heart and mediastinal contours are within normal limits. No focal opacities or effusions.  No acute bony abnormality.  No visible rib fracture.  No pneumothorax.  IMPRESSION: Negative exam.   Original Report Authenticated By: Charlett Nose, M.D.      1. Contusion of ribs, left, initial encounter    Motrin, IM Dilaudid  Rib pain instructions/ precautions and RX provided. Outpatient referral as needed.   MDM  Fall, rib contusion, no PTX or Fx identified on CXR reviewed as above.   Incentive spirometer provided with instructions  VS and nursing notes reviewed  Pain addressed with NSAIDs/ IM  narcotics        Sunnie Nielsen, MD 08/14/12 [phone]

## 2012-08-17 ENCOUNTER — Emergency Department (HOSPITAL_COMMUNITY)
Admission: EM | Admit: 2012-08-17 | Discharge: 2012-08-17 | Disposition: A | Discharge status: Home / Self Care | Payer: Self-pay | Attending: Emergency Medicine | Admitting: Emergency Medicine

## 2012-08-17 ENCOUNTER — Encounter (HOSPITAL_COMMUNITY): Payer: Self-pay

## 2012-08-17 DIAGNOSIS — Z87891 Personal history of nicotine dependence: Secondary | ICD-10-CM | POA: Insufficient documentation

## 2012-08-17 DIAGNOSIS — R0781 Pleurodynia: Secondary | ICD-10-CM

## 2012-08-17 DIAGNOSIS — Y929 Unspecified place or not applicable: Secondary | ICD-10-CM | POA: Insufficient documentation

## 2012-08-17 DIAGNOSIS — IMO0002 Reserved for concepts with insufficient information to code with codable children: Secondary | ICD-10-CM | POA: Insufficient documentation

## 2012-08-17 DIAGNOSIS — W19XXXA Unspecified fall, initial encounter: Secondary | ICD-10-CM | POA: Insufficient documentation

## 2012-08-17 DIAGNOSIS — Y939 Activity, unspecified: Secondary | ICD-10-CM | POA: Insufficient documentation

## 2012-08-17 MED ORDER — OXYCODONE-ACETAMINOPHEN 5-325 MG PO TABS: 1.0000 | ORAL_TABLET | Freq: Four times a day (QID) | ORAL | Status: DC | PRN

## 2012-08-17 NOTE — ED Provider Notes (Signed)
History     CSN: [id number]  Arrival date & time 08/17/12  1519   First MD Initiated Contact with Patient 08/17/12 1618      Chief Complaint  Patient presents with  . Fall    He states he fell this Tues./persistent left lower ribs area pain.    (Consider location/radiation/quality/duration/timing/severity/associated sxs/prior treatment) HPI Comments: Patient reports that he fell four days ago and landed on his left rib area.  Pain has been constant in the left lower ribs since that time.  Pain worse with taking a deep breath, coughing, sneezing, and also palpation.  He was seen in the ED after the fall and had an xray of his left ribs at that time, which was negative.  He has been taking Norco for pain with mild relief.  He has also been using Incentive spirometry several times a day.  He denies SOB, fever, or abdominal pain.  He does have an occasional non productive cough.    Patient is a 32 y.o. male presenting with fall. The history is provided by the patient.  Fall Pertinent negatives include no fever, no abdominal pain, no nausea and no vomiting.    History reviewed. No pertinent past medical history.  Past Surgical History  Procedure Laterality Date  . Appendectomy      History reviewed. No pertinent family history.  History  Substance Use Topics  . Smoking status: Former Smoker -- 1.00 packs/day    Types: Cigarettes  . Smokeless tobacco: Never Used  . Alcohol Use: No      Review of Systems  Constitutional: Negative for fever and chills.  Respiratory: Negative for cough and shortness of breath.   Gastrointestinal: Negative for nausea, vomiting and abdominal pain.  Musculoskeletal:       Left rib pain  Neurological: Negative for dizziness, syncope and light-headedness.    Allergies  Promethazine hcl  Home Medications   Current Outpatient Rx  Name  Route  Sig  Dispense  Refill  . Aspirin-Caffeine 845-65 MG PACK   Oral   Take 1 packet by mouth 2 (two)  times daily as needed. For pain         . HYDROcodone-acetaminophen (NORCO/VICODIN) 5-325 MG per tablet   Oral   Take 1 tablet by mouth every 6 (six) hours as needed for pain.   10 tablet   0   . ibuprofen (ADVIL,MOTRIN) 800 MG tablet   Oral   Take 800 mg by mouth 3 (three) times daily as needed for pain.           BP 134/82  Pulse 54  Temp(Src) 99.1 F (37.3 C) (Oral)  Resp 16  Wt 107 lb 5 oz (48.677 kg)  BMI 17.33 kg/m2  SpO2 100%  Physical Exam  Nursing note and vitals reviewed. Constitutional: He appears well-developed and well-nourished.  HENT:  Head: Normocephalic and atraumatic.  Neck: Normal range of motion. Neck supple.  Cardiovascular: Normal rate, regular rhythm and normal heart sounds.   Pulmonary/Chest: Effort normal and breath sounds normal. No respiratory distress. He has no wheezes. He has no rales. He exhibits tenderness.  Tenderness to palpation of the left ribs  Abdominal: Soft. There is no tenderness. There is no rebound and no guarding.  Musculoskeletal: Normal range of motion.  Neurological: He is alert.  Skin: Skin is warm and dry.  Psychiatric: He has a normal mood and affect.    ED Course  Procedures (including critical care time)  Labs Reviewed -  No data to display No results found.   No diagnosis found.  Looked up patient in the Narcotic Database.  He does not have frequent narcotic prescriptions filled.  MDM  Patient presenting with persistent left rib pain that has been present after falling four days ago.  He was seen in the ED after the injury and had an xray of the ribs at that time, which was negative.  He was also discharged home with Incentive Spirometry.  Therefore, do not feel that further imaging is indicated at this time.  Patient discharged home with pain medication.  Return precautions given.          Pascal Lux [place], PA-C 08/18/12 1424

## 2012-08-17 NOTE — ED Notes (Signed)
He states he fell in his yard as he was stepping off of his deck while carrying a microwave oven; landing on his left side. Seen here at our E. D. For this and had x-ray.  He is here today with c/o persistent pain at left lower ribs/lat. Lower thorax area. He is in no distress.

## 2012-08-18 NOTE — ED Provider Notes (Signed)
Medical screening examination/treatment/procedure(s) were performed by non-physician practitioner and as supervising physician I was immediately available for consultation/collaboration.   Gavin Pound. Khristine Verno, MD 08/18/12 1426

## 2013-09-24 ENCOUNTER — Emergency Department (HOSPITAL_COMMUNITY): Payer: Self-pay

## 2013-09-24 ENCOUNTER — Emergency Department (HOSPITAL_COMMUNITY)
Admission: EM | Admit: 2013-09-24 | Discharge: 2013-09-24 | Disposition: A | Discharge status: Home / Self Care | Payer: Self-pay | Attending: Emergency Medicine | Admitting: Emergency Medicine

## 2013-09-24 ENCOUNTER — Encounter (HOSPITAL_COMMUNITY): Payer: Self-pay | Admitting: Emergency Medicine

## 2013-09-24 DIAGNOSIS — K007 Teething syndrome: Secondary | ICD-10-CM | POA: Insufficient documentation

## 2013-09-24 DIAGNOSIS — M2559 Pain in other specified joint: Secondary | ICD-10-CM | POA: Insufficient documentation

## 2013-09-24 DIAGNOSIS — R112 Nausea with vomiting, unspecified: Secondary | ICD-10-CM | POA: Insufficient documentation

## 2013-09-24 DIAGNOSIS — R630 Anorexia: Secondary | ICD-10-CM | POA: Insufficient documentation

## 2013-09-24 DIAGNOSIS — IMO0001 Reserved for inherently not codable concepts without codable children: Secondary | ICD-10-CM | POA: Insufficient documentation

## 2013-09-24 DIAGNOSIS — Z8679 Personal history of other diseases of the circulatory system: Secondary | ICD-10-CM | POA: Insufficient documentation

## 2013-09-24 DIAGNOSIS — R6883 Chills (without fever): Secondary | ICD-10-CM | POA: Insufficient documentation

## 2013-09-24 DIAGNOSIS — R51 Headache: Secondary | ICD-10-CM | POA: Insufficient documentation

## 2013-09-24 DIAGNOSIS — D72829 Elevated white blood cell count, unspecified: Secondary | ICD-10-CM | POA: Insufficient documentation

## 2013-09-24 DIAGNOSIS — M791 Myalgia, unspecified site: Secondary | ICD-10-CM

## 2013-09-24 DIAGNOSIS — R5383 Other fatigue: Secondary | ICD-10-CM

## 2013-09-24 DIAGNOSIS — R5381 Other malaise: Secondary | ICD-10-CM | POA: Insufficient documentation

## 2013-09-24 DIAGNOSIS — R197 Diarrhea, unspecified: Secondary | ICD-10-CM | POA: Insufficient documentation

## 2013-09-24 DIAGNOSIS — M2550 Pain in unspecified joint: Secondary | ICD-10-CM

## 2013-09-24 LAB — CBC WITH DIFFERENTIAL/PLATELET
BASOS ABS: 0 10*3/uL (ref 0.0–0.1)
Basophils Relative: 0 % (ref 0–1)
EOS PCT: 0 % (ref 0–5)
Eosinophils Absolute: 0 10*3/uL (ref 0.0–0.7)
HCT: 45.6 % (ref 39.0–52.0)
Hemoglobin: 15.9 g/dL (ref 13.0–17.0)
LYMPHS PCT: 15 % (ref 12–46)
Lymphs Abs: 2.1 10*3/uL (ref 0.7–4.0)
MCH: 31.1 pg (ref 26.0–34.0)
MCHC: 34.9 g/dL (ref 30.0–36.0)
MCV: 89.2 fL (ref 78.0–100.0)
Monocytes Absolute: 0.6 10*3/uL (ref 0.1–1.0)
Monocytes Relative: 4 % (ref 3–12)
NEUTROS ABS: 11.5 10*3/uL — AB (ref 1.7–7.7)
NEUTROS PCT: 81 % — AB (ref 43–77)
PLATELETS: 254 10*3/uL (ref 150–400)
RBC: 5.11 MIL/uL (ref 4.22–5.81)
RDW: 13.6 % (ref 11.5–15.5)
WBC: 14.2 10*3/uL — AB (ref 4.0–10.5)

## 2013-09-24 LAB — COMPREHENSIVE METABOLIC PANEL
ALT: 11 U/L (ref 0–53)
AST: 22 U/L (ref 0–37)
Albumin: 4.4 g/dL (ref 3.5–5.2)
Alkaline Phosphatase: 88 U/L (ref 39–117)
BUN: 12 mg/dL (ref 6–23)
CALCIUM: 9.5 mg/dL (ref 8.4–10.5)
CO2: 22 meq/L (ref 19–32)
Chloride: 99 mEq/L (ref 96–112)
Creatinine, Ser: 0.67 mg/dL (ref 0.50–1.35)
GFR calc Af Amer: >90 mL/min (ref >90)
Glucose, Bld: 139 mg/dL — ABNORMAL HIGH (ref 70–99)
POTASSIUM: 4.2 meq/L (ref 3.7–5.3)
SODIUM: 138 meq/L (ref 137–147)
Total Bilirubin: 0.4 mg/dL (ref 0.3–1.2)
Total Protein: 8 g/dL (ref 6.0–8.3)

## 2013-09-24 LAB — URINALYSIS, ROUTINE W REFLEX MICROSCOPIC
GLUCOSE, UA: NEGATIVE mg/dL
HGB URINE DIPSTICK: NEGATIVE
Ketones, ur: 15 mg/dL — AB
Leukocytes, UA: NEGATIVE
Nitrite: NEGATIVE
PROTEIN: 30 mg/dL — AB
SPECIFIC GRAVITY, URINE: 1.029 (ref 1.005–1.030)
Urobilinogen, UA: 0.2 mg/dL (ref 0.0–1.0)
pH: 5.5 (ref 5.0–8.0)

## 2013-09-24 LAB — URINE MICROSCOPIC-ADD ON

## 2013-09-24 LAB — RAPID STREP SCREEN (MED CTR MEBANE ONLY): STREPTOCOCCUS, GROUP A SCREEN (DIRECT): NEGATIVE

## 2013-09-24 LAB — LIPASE, BLOOD: Lipase: 16 U/L (ref 11–59)

## 2013-09-24 NOTE — ED Notes (Signed)
Pt tolerating oral fluids 

## 2013-09-24 NOTE — Discharge Instructions (Signed)
Viral Infections As discussed there is some concern for possible infection of your heart called endocarditis. Return to the ED if you develop fever, chest pain or shortness of breath. You will be called if your blood cultures are positive. A viral infection can be caused by different types of viruses.Most viral infections are not serious and resolve on their own. However, some infections may cause severe symptoms and may lead to further complications. SYMPTOMS Viruses can frequently cause:  Minor sore throat.  Aches and pains.  Headaches.  Runny nose.  Different types of rashes.  Watery eyes.  Tiredness.  Cough.  Loss of appetite.  Gastrointestinal infections, resulting in nausea, vomiting, and diarrhea. These symptoms do not respond to antibiotics because the infection is not caused by bacteria. However, you might catch a bacterial infection following the viral infection. This is sometimes called a "superinfection." Symptoms of such a bacterial infection may include:  Worsening sore throat with pus and difficulty swallowing.  Swollen neck glands.  Chills and a high or persistent fever.  Severe headache.  Tenderness over the sinuses.  Persistent overall ill feeling (malaise), muscle aches, and tiredness (fatigue).  Persistent cough.  Yellow, green, or brown mucus production with coughing. HOME CARE INSTRUCTIONS   Only take over-the-counter or prescription medicines for pain, discomfort, diarrhea, or fever as directed by your caregiver.  Drink enough water and fluids to keep your urine clear or pale yellow. Sports drinks can provide valuable electrolytes, sugars, and hydration.  Get plenty of rest and maintain proper nutrition. Soups and broths with crackers or rice are fine. SEEK IMMEDIATE MEDICAL CARE IF:   You have severe headaches, shortness of breath, chest pain, neck pain, or an unusual rash.  You have uncontrolled vomiting, diarrhea, or you are unable to  keep down fluids.  You or your child has an oral temperature above 102 F (38.9 C), not controlled by medicine.  Your baby is older than 3 months with a rectal temperature of 102 F (38.9 C) or higher.  Your baby is 613 months old or younger with a rectal temperature of 100.4 F (38 C) or higher. MAKE SURE YOU:   Understand these instructions.  Will watch your condition.  Will get help right away if you are not doing well or get worse. Document Released: 12/28/2004 Document Revised: 06/12/2011 Document Reviewed: 07/25/2010 [facility] Patient Information 2015 [employer], [place]. This information is not intended to replace advice given to you by your health care provider. Make sure you discuss any questions you have with your health care provider.

## 2013-09-24 NOTE — ED Notes (Signed)
Pt c/o afebrile, gen body aches since Saturday.  C/o "a few episodes" of vomiting, diarrhea.

## 2013-09-24 NOTE — ED Provider Notes (Signed)
CSN: [id number]     Arrival date & time 09/24/13  [id number] History   First MD Initiated Contact with Patient 09/24/13 0957     Chief Complaint  Patient presents with  . Generalized Body Aches  . Nausea  . Emesis  . Diarrhea     (Consider location/radiation/quality/duration/timing/severity/associated sxs/prior Treatment) HPI Comments: Patient presents with a four-day history of body aches, chills and "not feeling well". Denies any fevers. States he said intermittent headaches that he Associates as migraines with a couple episodes of vomiting. Denies any focal weakness, numbness or tingling. Denies any chest pain or shortness of breath. Denies any abdominal pain. Denies any sick contacts. Denies any bowel or bladder incontinence. Denies any IV drug abuse. Denies any chronic medical problems.  The history is provided by the patient.    History reviewed. No pertinent past medical history. Past Surgical History  Procedure Laterality Date  . Appendectomy     No family history on file. History  Substance Use Topics  . Smoking status: Former Smoker -- 1.00 packs/day    Types: Cigarettes  . Smokeless tobacco: Never Used  . Alcohol Use: No    Review of Systems  Constitutional: Positive for chills, activity change, appetite change and fatigue. Negative for fever.  HENT: Negative for congestion and rhinorrhea.   Respiratory: Negative for cough, chest tightness, shortness of breath and stridor.   Cardiovascular: Negative for chest pain.  Gastrointestinal: Positive for vomiting and diarrhea.  Genitourinary: Negative for dysuria and hematuria.  Musculoskeletal: Positive for arthralgias and myalgias. Negative for neck pain and neck stiffness.  Skin: Negative for rash.  Neurological: Positive for weakness. Negative for dizziness and headaches.  A complete 10 system review of systems was obtained and all systems are negative except as noted in the HPI and PMH.      Allergies  Promethazine  hcl  Home Medications   Prior to Admission medications   Medication Sig Start Date End Date Taking? Authorizing Provider  aspirin-acetaminophen-caffeine (EXCEDRIN MIGRAINE) [id number] MG per tablet Take 1-2 tablets by mouth every 6 (six) hours as needed for headache.   Yes Historical Provider, MD  Aspirin-Caffeine 845-65 MG PACK Take 1 packet by mouth 2 (two) times daily as needed. For pain   Yes Historical Provider, MD   BP 139/67  Pulse 66  Temp(Src) 98.6 F (37 C) (Oral)  Resp 18  SpO2 100% Physical Exam  Nursing note and vitals reviewed. Constitutional: He is oriented to person, place, and time. He appears well-developed and well-nourished. No distress.  Thin appearing  HENT:  Head: Normocephalic and atraumatic.  Mouth/Throat: Oropharynx is clear and moist. No oropharyngeal exudate.  Poor dentition  Eyes: Conjunctivae and EOM are normal. Pupils are equal, round, and reactive to light.  Neck: Normal range of motion. Neck supple.  No meningismus.  Cardiovascular: Normal rate, regular rhythm, normal heart sounds and intact distal pulses.   No murmur heard. Pulmonary/Chest: Effort normal and breath sounds normal. No respiratory distress.  Abdominal: Soft. There is no tenderness. There is no rebound and no guarding.  Musculoskeletal: Normal range of motion. He exhibits no edema and no tenderness.  5/5 strength in bilateral lower extremities. Ankle plantar and dorsiflexion intact. Great toe extension intact bilaterally. +2 DP and PT pulses. +2 patellar reflexes bilaterally. Normal gait.   Neurological: He is alert and oriented to person, place, and time. No cranial nerve deficit. He exhibits normal muscle tone. Coordination normal.  No ataxia on finger to nose bilaterally. No  pronator drift. 5/5 strength throughout. CN 2-12 intact. Negative Romberg. Equal grip strength. Sensation intact. Gait is normal.   Skin: Skin is warm.  Psychiatric: He has a normal mood and affect. His  behavior is normal.    ED Course  Procedures (including critical care time) Labs Review Labs Reviewed  CBC WITH DIFFERENTIAL - Abnormal; Notable for the following:    WBC 14.2 (*)    Neutrophils Relative % 81 (*)    Neutro Abs 11.5 (*)    All other components within normal limits  COMPREHENSIVE METABOLIC PANEL - Abnormal; Notable for the following:    Glucose, Bld 139 (*)    All other components within normal limits  URINALYSIS, ROUTINE W REFLEX MICROSCOPIC - Abnormal; Notable for the following:    Color, Urine AMBER (*)    APPearance CLOUDY (*)    Bilirubin Urine SMALL (*)    Ketones, ur 15 (*)    Protein, ur 30 (*)    All other components within normal limits  RAPID STREP SCREEN  CULTURE, BLOOD (ROUTINE X 2)  CULTURE, BLOOD (ROUTINE X 2)  CULTURE, GROUP A STREP  LIPASE, BLOOD  URINE MICROSCOPIC-ADD ON    Imaging Review Dg Chest 2 View  09/24/2013   CLINICAL DATA:  Chest pain  EXAM: CHEST  2 VIEW  COMPARISON:  None.  FINDINGS: The heart size and mediastinal contours are within normal limits. Both lungs are clear. The visualized skeletal structures are unremarkable.  IMPRESSION: No active cardiopulmonary disease.   Electronically Signed   By: Alcide CleverMark  Lukens M.D.   On: 09/24/2013 11:27     EKG Interpretation   Date/Time:  Wednesday September 24 2013 13:31:20 EDT Ventricular Rate:  60 PR Interval:  120 QRS Duration: 103 QT Interval:  402 QTC Calculation: 402 R Axis:   57 Text Interpretation:  Sinus rhythm Baseline wander in lead(s) V4 V6 No  previous ECGs available Confirmed by Manus GunningANCOUR  MD, STEPHEN [phone]([id number]) on  09/24/2013 1:35:05 PM      MDM   Final diagnoses:  Myalgia   Bodyaches with nausea, headaches, vomiting x 4 days.  No CP or SOB.  Poor dentition, denies IV drug use but admits to nurse that her used in past.  No murmur, poor dentition.  Given poor dentition and history of IV drug use, endocarditis considered and blood cultures will be obtained.  Labs remarkable  for leukocytosis of 14. on chart review however patient has chronic leukocytosis. He is afebrile. He has no murmur. Blood cultures were sent.  I discussed my concern of possible endocarditis with the patient. He knows to return immediately if he develops a fever, chest pain, shortness of breath or any other worsening symptoms. He'll be called if his blood cultures are positive.  Glynn OctaveStephen Rancour, MD 09/24/13 [id number]

## 2013-09-26 LAB — CULTURE, GROUP A STREP

## 2013-09-30 LAB — CULTURE, BLOOD (ROUTINE X 2)
Culture: NO GROWTH
Culture: NO GROWTH

## 2013-10-27 ENCOUNTER — Encounter (HOSPITAL_BASED_OUTPATIENT_CLINIC_OR_DEPARTMENT_OTHER): Payer: Self-pay | Admitting: Emergency Medicine

## 2013-10-27 ENCOUNTER — Emergency Department (HOSPITAL_BASED_OUTPATIENT_CLINIC_OR_DEPARTMENT_OTHER)
Admission: EM | Admit: 2013-10-27 | Discharge: 2013-10-27 | Disposition: A | Discharge status: Home / Self Care | Payer: Self-pay | Attending: Emergency Medicine | Admitting: Emergency Medicine

## 2013-10-27 DIAGNOSIS — K002 Abnormalities of size and form of teeth: Secondary | ICD-10-CM | POA: Insufficient documentation

## 2013-10-27 DIAGNOSIS — K029 Dental caries, unspecified: Secondary | ICD-10-CM | POA: Insufficient documentation

## 2013-10-27 DIAGNOSIS — F17200 Nicotine dependence, unspecified, uncomplicated: Secondary | ICD-10-CM | POA: Insufficient documentation

## 2013-10-27 DIAGNOSIS — K089 Disorder of teeth and supporting structures, unspecified: Secondary | ICD-10-CM | POA: Insufficient documentation

## 2013-10-27 DIAGNOSIS — K0889 Other specified disorders of teeth and supporting structures: Secondary | ICD-10-CM

## 2013-10-27 HISTORY — DX: Dental caries, unspecified: K02.9

## 2013-10-27 MED ORDER — IBUPROFEN 800 MG PO TABS: 800.0000 mg | ORAL_TABLET | Freq: Three times a day (TID) | ORAL | Status: DC

## 2013-10-27 MED ORDER — PENICILLIN V POTASSIUM 500 MG PO TABS: 500.0000 mg | ORAL_TABLET | Freq: Four times a day (QID) | ORAL | Status: AC

## 2013-10-27 MED ORDER — HYDROCODONE-ACETAMINOPHEN 5-325 MG PO TABS: 2.0000 | ORAL_TABLET | Freq: Once | ORAL | Status: DC

## 2013-10-27 NOTE — Discharge Instructions (Signed)
Dental Caries °Dental caries (also called tooth decay) is the most common oral disease. It can occur at any age but is more common in children and young adults.  °HOW DENTAL CARIES DEVELOPS  °The process of decay begins when bacteria and foods (particularly sugars and starches) combine in your mouth to produce plaque. Plaque is a substance that sticks to the hard, outer surface of a tooth (enamel). The bacteria in plaque produce acids that attack enamel. These acids may also attack the root surface of a tooth (cementum) if it is exposed. Repeated attacks dissolve these surfaces and create holes in the tooth (cavities). If left untreated, the acids destroy the other layers of the tooth.  °RISK FACTORS °· Frequent sipping of sugary beverages.   °· Frequent snacking on sugary and starchy foods, especially those that easily get stuck in the teeth.   °· Poor oral hygiene.   °· Dry mouth.   °· Substance abuse such as methamphetamine abuse.   °· Broken or poor-fitting dental restorations.   °· Eating disorders.   °· Gastroesophageal reflux disease (GERD).   °· Certain radiation treatments to the head and neck. °SYMPTOMS °In the early stages of dental caries, symptoms are seldom present. Sometimes white, chalky areas may be seen on the enamel or other tooth layers. In later stages, symptoms may include: °· Pits and holes on the enamel. °· Toothache after sweet, hot, or cold foods or drinks are consumed. °· Pain around the tooth. °· Swelling around the tooth. °DIAGNOSIS  °Most of the time, dental caries is detected during a regular dental checkup. A diagnosis is made after a thorough medical and dental history is taken and the surfaces of your teeth are checked for signs of dental caries. Sometimes special instruments, such as lasers, are used to check for dental caries. Dental X-ray exams may be taken so that areas not visible to the eye (such as between the contact areas of the teeth) can be checked for cavities.    °TREATMENT  °If dental caries is in its early stages, it may be reversed with a fluoride treatment or an application of a remineralizing agent at the dental office. Thorough brushing and flossing at home is needed to aid these treatments. If it is in its later stages, treatment depends on the location and extent of tooth destruction:  °· If a small area of the tooth has been destroyed, the destroyed area will be removed and cavities will be filled with a material such as gold, silver amalgam, or composite resin.   °· If a large area of the tooth has been destroyed, the destroyed area will be removed and a cap (crown) will be fitted over the remaining tooth structure.   °· If the center part of the tooth (pulp) is affected, a procedure called a root canal will be needed before a filling or crown can be placed.   °· If most of the tooth has been destroyed, the tooth may need to be pulled (extracted). °HOME CARE INSTRUCTIONS °You can prevent, stop, or reverse dental caries at home by practicing good oral hygiene. Good oral hygiene includes: °· Thoroughly cleaning your teeth at least twice a day with a toothbrush and dental floss.   °· Using a fluoride toothpaste. A fluoride mouth rinse may also be used if recommended by your dentist or health care provider.   °· Restricting the amount of sugary and starchy foods and sugary liquids you consume.   °· Avoiding frequent snacking on these foods and sipping of these liquids.   °· Keeping regular visits with   a dentist for checkups and cleanings. °PREVENTION  °· Practice good oral hygiene. °· Consider a dental sealant. A dental sealant is a coating material that is applied by your dentist to the pits and grooves of teeth. The sealant prevents food from being trapped in them. It may protect the teeth for several years. °· Ask about fluoride supplements if you live in a community without fluorinated water or with water that has a low fluoride content. Use fluoride supplements  as directed by your dentist or health care provider. °· Allow fluoride varnish applications to teeth if directed by your dentist or health care provider. °Document Released: 12/10/2001 Document Revised: 08/04/2013 Document Reviewed: 03/22/2012 °ExitCare® Patient Information ©2015 [employer]. This information is not intended to replace advice given to you by your health care provider. Make sure you discuss any questions you have with your health care provider. ° °Emergency Department Resource Guide °1) Find a Doctor and Pay Out of Pocket °Although you won't have to find out who is covered by your insurance plan, it is a good idea to ask around and get recommendations. You will then need to call the office and see if the doctor you have chosen will accept you as a new patient and what types of options they offer for patients who are self-pay. Some doctors offer discounts or will set up payment plans for their patients who do not have insurance, but you will need to ask so you aren't surprised when you get to your appointment. ° °2) Contact Your Local Health Department °Not all health departments have doctors that can see patients for sick visits, but many do, so it is worth a call to see if yours does. If you don't know where your local health department is, you can check in your phone book. The CDC also has a tool to help you locate your state's health department, and many state websites also have listings of all of their local health departments. ° °3) Find a Walk-in Clinic °If your illness is not likely to be very severe or complicated, you may want to try a walk in clinic. These are popping up all over the country in pharmacies, drugstores, and shopping centers. They're usually staffed by nurse practitioners or physician assistants that have been trained to treat common illnesses and complaints. They're usually fairly quick and inexpensive. However, if you have serious medical issues or chronic medical  problems, these are probably not your best option. ° °No Primary Care Doctor: °- Call [facility] Connect at  832-8000 - they can help you locate a primary care doctor that  accepts your insurance, provides certain services, etc. °- Physician Referral Service- 1-800-533-3463 ° °Chronic Pain Problems: °Organization         Address  Phone   Notes  °[facility]  (336) 297-2271 Patients need to be referred by their primary care doctor.  ° °Medication Assistance: °Organization         Address  Phone   Notes  °[place] [facility] [address] °[place], [place] [address] (336) 641-8030 --Must be a resident of [place] °-- Must have NO insurance coverage whatsoever (no Medicaid/ Medicare, etc.) °-- The pt. MUST have a primary care doctor that directs their care regularly and follows them in the community °  °[facility]  (866) 331-1348   °[employer]  (888) 892-1162   ° °Agencies that provide inexpensive medical care: °Organization           Address  Phone   Notes  °[facility]  (336) 832-8035   °[facility]    (336) 832-7272   °[facility] [address] °[place], [place] [address] (336) 832-4777   °[facility] [address], °[place] (336) 271-4999   °[facility]    (336) 373-0678   °[facility]    (336) 272-1050   °[facility] ° [address], [place] Phone:  (336) 832-4444, Fax:  (336) 832-4440 Hours of Operation:  9 am - 6 pm, M-F.  Also accepts Medicaid/Medicare and self-pay.  °[facility] ° [address], [place] Phone: (336) 832-3150, Fax: (336) [phone]. Hours of Operation:  8:30 am - 5:30 pm, M-F.  Also accepts Medicaid and self-pay.  °[facility] [place] [address], [place] Phone: (336) 878-6027   °[facility] [address], [place], [place] (336)723-1848, Ext. 123  Mondays & Thursdays: 7-9 AM.  First 15 patients are seen on a first come, first serve basis. °  ° °Medicaid-accepting [place] Providers: ° °Organization         Address  Phone   Notes  °[facility] [address], [place] (336) 641-2100 Also accepts self-pay patients.  °[facility] [address], [place] ° (336) 856-9996   °[facility] [address], [place] (336) 288-8857   °[facility] [address] [place] [address], [place] (336) 299-7000   °Veita Bland [address], [place]  ° (336) 373-1557 Only accepts [place] Access Medicaid patients after they have their name applied to their card.  ° °Self-Pay (no insurance) in [place]: ° °Organization         Address  Phone   Notes  °Sickle Cell Patients, [facility] [address], [place] (336) 832-1970   °[facility] [address], [place] (336) 832-4400   ° [facility] ° [address], [place] (336) 992-4800   °[facility]/Dr. Osei-Bonsu ° [address] [place] [address], [place] or [address], [place] (336) [phone] Phone number for both [place] and [place] locations is the same.  °[facility] [address], [place] (336) 299-0000   °[facility] [address] [place] [address], [place] or [address] (336) 852-7530 °(336) 878-2260   °[facility] [address], [place] (336) 350-1642, phone; (336) 294-5005, fax Sees patients 1st and 3rd Saturday of every month.  Must not qualify for public or private insurance (i.e. Medicaid, Medicare, [place] Health Choice, Veterans' Benefits) • Household income should be no more than 200% of the poverty level •The clinic cannot treat you if you are pregnant or think you are pregnant • Sexually transmitted diseases are not treated at  the clinic.  ° ° °Dental Care: °Organization         Address  Phone  Notes  °[place] [facility] [address], [place] (336) 641-6152 Accepts children up to age 21 who are enrolled in Medicaid or [place] Health Choice; pregnant women with a Medicaid card; and children who have applied for Medicaid or [place] Health Choice, but were declined, whose parents can pay a reduced fee at time   of service.  °[place] [place]  [address], [place] (336) 641-7733 Accepts children up to age 21 who are enrolled in Medicaid or [place] Health Choice; pregnant women with a Medicaid card; and children who have applied for Medicaid or [place] Health Choice, but were declined, whose parents can pay a reduced fee at time of service.  °[facility] ° [address], [place] (336) 641-4533 Patients are seen by appointment only. Walk-ins are not accepted. [facility] will see patients 18 years of age and older. °Monday - Tuesday (8am-5pm) °Most Wednesdays (8:30-5pm) °$30 per visit, cash only  °[facility] ° [address], [place] (336) 641-4533 Patients are seen by appointment only. Walk-ins are not accepted. [facility] will see patients 18 years of age and older. °One Wednesday Evening (Monthly: Volunteer Based).  $30 per visit, cash only  °[facility]  (919) 537-3737 for adults; Children under age 4, call Graduate Pediatric Dentistry at ([phone]) [phone]. Children aged 4-14, please call ([phone]) [phone] to request a pediatric application. ° Dental services are provided in all areas of dental care including fillings, crowns and bridges, complete and partial dentures, implants, gum treatment, root canals, and extractions. Preventive care is also provided. Treatment is provided to both adults and children. °Patients are selected via a lottery and there is often a  waiting list. °  °[facility] [address], °[place] ° (336) 763-8833 [email or web address].drcivils.[email or web address] °  °[facility] [address], [place], [place] (336)723-1848, Ext. 123 Second and Fourth Thursday of each month, opens at 6:30 AM; Clinic ends at 9 AM.  Patients are seen on a first-come first-served basis, and a limited number are seen during each clinic.  ° °[facility] ° [address], [place], [place] (336) 723-7904   Eligibility Requirements °You must have lived in [place], [place], or [place] counties for at least the last three months. °  You cannot be eligible for state or federal sponsored healthcare insurance, including Veterans Administration, Medicaid, or Medicare. °  You generally cannot be eligible for healthcare insurance through your employer.  °  How to apply: °Eligibility screenings are held every Tuesday and Wednesday afternoon from 1:00 pm until 4:00 pm. You do not need an appointment for the interview!  °[facility] [address], [place], [place] 336-631-2330   °[facility]  336-342-8273   °[facility]  336-703-3100   °[facility]  336-570-6415   ° °Behavioral Health Resources in the Community: °Intensive Outpatient Programs °Organization         Address  Phone  Notes  °[facility] [address], [place], [place] 336-878-6098   °[facility] [address], [place], [place] 336-832-9800   °[facility] [address], [place], [place] ° 336-882-2125   °[facility] [address],  °[place], [place] 1-800-853-5163 or 336-641-4981   °Substance Abuse Resources °Organization         Address  Phone  Notes  °[facility]  336-882-2125   °[facility]  336-784-9470   °[facility]  336-285-9073   °[facility]  336-845-3988   °Residential & Outpatient Substance Abuse  Program  1-800-659-3381   °Psychological Services °Organization         Address    Phone  Notes  [facility]  336928-526-3862   [facility]  352-075-0072   [facility] [address], [place] (606)178-5385 or 973-823-1699    Mobile Crisis Teams Organization         Address  Phone  Notes  [facility], Mobile Crisis Care Unit  843-279-5917   [facility]  [address] [place], [place] 425-956-3875   Doristine Locks [address] [place] 643-329-5188    Self-Help/Support Groups Organization         Address  Phone             Notes  [facility] [place] - variety of support groups  336- I7437963 Call for more information  Narcotics Anonymous (NA), [facility] [address], [employer] [place]  2 meetings at this location   Statistician         Address  Phone  Notes  [facility] [address],    [place]  4-166-063-0160   [facility]  [address] [place] [address], [place], [place] 557-322-0254   [facility] [address] [place], [place] 270-623-7628 Admissions: 8am-3pm M-F  [facility] [address],    [place], [place] 315-176-1607   [facility] [address] [place], [place], [place] 371-062-6948   [facility] [address],  [place], [place] 546-270-3500   [facility] Intensive Outpatient [address], [place], [place] 938-182-9937   [facility] ([facility] [address] [place]  [place], [place] 1-696-789-3810 or 434-347-1519   [facility] [address], [place], [place] 778-242-3536 Accepts Medicaid  [facility] [address]  [place] 1-443-154-0086 Substance Abuse/Addiction Treatment   [facility] Organization          Address  Phone  Notes  [facility]  [phone]   Angie Fava, PhD [address] Ervin Knack [place], [place]   479-623-2233 or 201-051-9111   [facility]   [address] [place], [place] 574-516-1207   [facility] [address], [place], [place] 4305797647 Insurance/Medicaid/sponsorship through [facility] and Families [address]                                    [place], [place] (774)363-2681 Therapy/tele-psych/case  [facility] [address], [place] (435)648-4176    Dr. Lolly Mustache  209-351-7150   [facility]  [employer] [facility] Dept. 1) [address] [place] 2) [address], [place] 3)  [address], [place] 804-655-7397 201-667-0575  (928) 252-4211   [facility] Child Abuse Hotline 910-316-0489 or [phone] (After Hours)       Dental Pain A tooth ache may be caused by cavities (tooth decay). Cavities expose the nerve of the tooth to air and hot or cold temperatures. It may come from an infection or abscess (also called a boil or furuncle) around your tooth. It is also often caused by dental caries (tooth decay). This causes the pain you are having. DIAGNOSIS  Your caregiver can diagnose this problem by exam. TREATMENT   If caused by an infection, it may  be treated with medications which kill germs (antibiotics) and pain medications as prescribed by your caregiver. Take medications as directed.  Only take over-the-counter or prescription medicines for pain, discomfort, or fever as directed by your caregiver.  Whether the tooth ache today is caused by infection or dental disease, you should see your dentist as soon as possible for further care. SEEK MEDICAL CARE IF: The exam and treatment you received today has been provided on an emergency basis only. This is not a substitute for complete medical or dental care. If your problem worsens or new problems  (symptoms) appear, and you are unable to meet with your dentist, call or return to this location. SEEK IMMEDIATE MEDICAL CARE IF:   You have a fever.  You develop redness and swelling of your face, jaw, or neck.  You are unable to open your mouth.  You have severe pain uncontrolled by pain medicine. MAKE SURE YOU:   Understand these instructions.  Will watch your condition.  Will get help right away if you are not doing well or get worse. Document Released: 03/20/2005 Document Revised: 06/12/2011 Document Reviewed: 11/06/2007 [facility] Patient Information 2015 [place], [place]. This information is not intended to replace advice given to you by your health care provider. Make sure you discuss any questions you have with your health care provider.

## 2013-10-27 NOTE — ED Notes (Signed)
Pt having right lower tooth pain for one week.

## 2013-10-27 NOTE — ED Provider Notes (Signed)
CSN: [id number]     Arrival date & time 10/27/13  1522 History  This chart was scribed for Dagmar HaitWilliam Yen Wandell, MD by Milly JakobJohn Lee Graves, ED Scribe. The patient was seen in room MHH1/MHH1. Patient's care was started at 5:14 PM.     Chief Complaint  Patient presents with  . Dental Pain   Patient is a 33 y.o. male presenting with tooth pain.  Dental Pain Location:  Lower Quality:  Aching and constant Severity:  Severe Context: poor dentition   Context: not trauma   Relieved by:  Nothing Associated symptoms: no fever    HPI Comments: Delanna AhmadiJason M Zylka is a 33 y.o. male who presents to the Emergency Department complaining of severe, right, lower tooth pain. He denies fever, nausea, vomiting, and difficulty swallowing.   Past Medical History  Diagnosis Date  . Dental caries    Past Surgical History  Procedure Laterality Date  . Appendectomy     No family history on file. History  Substance Use Topics  . Smoking status: Current Every Day Smoker -- 1.00 packs/day    Types: Cigarettes  . Smokeless tobacco: Never Used  . Alcohol Use: No    Review of Systems  Constitutional: Negative for fever.  HENT: Positive for dental problem.   Gastrointestinal: Negative for nausea and vomiting.  All other systems reviewed and are negative.     Allergies  Promethazine hcl  Home Medications   Prior to Admission medications   Not on File   Triage Vitals: BP 148/73  Pulse 105  Temp(Src) 98.8 F (37.1 C) (Oral)  Resp 14  Ht 5\' 6"  (1.676 m)  Wt 110 lb (49.896 kg)  BMI 17.76 kg/m2  SpO2 100% Physical Exam  Nursing note and vitals reviewed. Constitutional: He is oriented to person, place, and time. He appears well-developed and well-nourished. No distress.  HENT:  Head: Normocephalic and atraumatic.  Mouth/Throat: Oropharynx is clear and moist. He does not have dentures. No oral lesions. Abnormal dentition (many broken teeth, severe dental decay in molars and canine bilaterally, upper  and lower). Dental caries present. No dental abscesses or lacerations.  Eyes: Conjunctivae and EOM are normal.  Neck: Neck supple. No tracheal deviation present.  Cardiovascular: Normal rate.   Pulmonary/Chest: Effort normal. No respiratory distress.  Musculoskeletal: Normal range of motion.  Neurological: He is alert and oriented to person, place, and time.  Skin: Skin is warm and dry.  Psychiatric: He has a normal mood and affect. His behavior is normal.    ED Course  Procedures (including critical care time) DIAGNOSTIC STUDIES: Oxygen Saturation is 100% on room air, normal by my interpretation.    COORDINATION OF CARE: 5:18 PM-Discussed treatment plan which includes pain medication with pt at bedside and pt agreed to plan.   Labs Review Labs Reviewed - No data to display  Imaging Review No results found.   EKG Interpretation None      MDM   Final diagnoses:  Dental caries  Pain, dental    [age] year old male presents with dental pain. Right lower mandible pain for the past week. No fevers, facial swelling, difficulty breathing. On exam he has stable vitals. He has no stridor or airway swelling. No intraoral abscess. Diffuse dental decay regarding molars and canines bilaterally and maxilla and mandible. Some dental tenderness diffusely. Many broken teeth present also. Given 2 Vicodin here, given high dose NSAIDs. He refused nerve block. Given resource guide for dental followup.  I personally performed the services  described in this documentation, which was scribed in my presence. The recorded information has been reviewed and is accurate.     Dagmar Hait, MD 10/27/13 [phone]

## 2013-12-29 ENCOUNTER — Emergency Department (HOSPITAL_COMMUNITY)
Admission: EM | Admit: 2013-12-29 | Discharge: 2013-12-29 | Disposition: A | Discharge status: Home / Self Care | Payer: Self-pay

## 2013-12-29 NOTE — ED Notes (Signed)
Patient called, second attempt, no answer.

## 2013-12-29 NOTE — ED Notes (Signed)
Patient called, 3rd attempt, no answer. Patient discharged at this time, LWBS before triage.

## 2013-12-29 NOTE — ED Notes (Signed)
Attempt to call patient no answer.

## 2014-01-06 ENCOUNTER — Encounter (HOSPITAL_COMMUNITY): Payer: Self-pay | Admitting: Emergency Medicine

## 2014-01-06 ENCOUNTER — Emergency Department (HOSPITAL_COMMUNITY)
Admission: EM | Admit: 2014-01-06 | Discharge: 2014-01-06 | Disposition: A | Discharge status: Home / Self Care | Payer: Self-pay | Attending: Emergency Medicine | Admitting: Emergency Medicine

## 2014-01-06 DIAGNOSIS — R519 Headache, unspecified: Secondary | ICD-10-CM

## 2014-01-06 DIAGNOSIS — G43909 Migraine, unspecified, not intractable, without status migrainosus: Secondary | ICD-10-CM | POA: Insufficient documentation

## 2014-01-06 DIAGNOSIS — Z720 Tobacco use: Secondary | ICD-10-CM | POA: Insufficient documentation

## 2014-01-06 DIAGNOSIS — R51 Headache: Secondary | ICD-10-CM

## 2014-01-06 HISTORY — DX: Migraine, unspecified, not intractable, without status migrainosus: G43.909

## 2014-01-06 MED ORDER — METOCLOPRAMIDE HCL 10 MG PO TABS
10.0000 mg | ORAL_TABLET | Freq: Once | ORAL | Status: AC
  Administered 2014-01-06: 10 mg via ORAL
  Filled 2014-01-06: qty 1

## 2014-01-06 MED ORDER — DIPHENHYDRAMINE HCL 25 MG PO CAPS
50.0000 mg | ORAL_CAPSULE | Freq: Once | ORAL | Status: AC
  Administered 2014-01-06: 50 mg via ORAL
  Filled 2014-01-06: qty 2

## 2014-01-06 MED ORDER — ONDANSETRON HCL 4 MG PO TABS: 4.0000 mg | ORAL_TABLET | Freq: Four times a day (QID) | ORAL | Status: DC

## 2014-01-06 MED ORDER — IBUPROFEN 800 MG PO TABS
800.0000 mg | ORAL_TABLET | Freq: Once | ORAL | Status: AC
  Administered 2014-01-06: 800 mg via ORAL
  Filled 2014-01-06: qty 1

## 2014-01-06 NOTE — ED Provider Notes (Signed)
CSN: [id number]     Arrival date & time 01/06/14  1237 History  This chart was scribed for non-physician practitioner, Oswaldo Conroy PA-C, working with Shon Baton, MD by Milly Jakob, ED Scribe. The patient was seen in room WTR5/WTR5. Patient's care was started at 1:04 PM.     Chief Complaint  Patient presents with  . Headache    pain on top of head x 10 days   The history is provided by the patient. No language interpreter was used.   HPI Comments: David Mcneil is a 33 y.o. male who presents to the Emergency Department complaining of a migraine headache onset 10 days ago. He reports that that this headache began gradually and became severe. He reports a history of migraines, but states that these headaches have become more frequent and slightly more severe over time. He reports using Excedrin and BC powders with minimal relief. He reports associated chills, sensitivity to light, nausea and vomiting. One episode of emesis, nonbloody. He denies rash, fever, blurred vision, slurred speach, numbness or weakness. He denies having a PCP.  Past Medical History  Diagnosis Date  . Dental caries   . Migraine    Past Surgical History  Procedure Laterality Date  . Appendectomy     History reviewed. No pertinent family history. History  Substance Use Topics  . Smoking status: Current Every Day Smoker -- 1.00 packs/day    Types: Cigarettes  . Smokeless tobacco: Never Used  . Alcohol Use: No    Review of Systems  Constitutional: Positive for chills. Negative for fever.  Eyes: Negative for visual disturbance.  Respiratory: Negative for cough and shortness of breath.   Cardiovascular: Negative for chest pain.  Gastrointestinal: Positive for nausea and vomiting. Negative for abdominal pain.  Musculoskeletal: Negative for gait problem.  Skin: Negative for rash.  Neurological: Positive for headaches. Negative for weakness and numbness.   Allergies  Promethazine hcl  Home  Medications   Prior to Admission medications   Medication Sig Start Date End Date Taking? Authorizing Provider  aspirin-acetaminophen-caffeine (EXCEDRIN MIGRAINE) [phone] MG per tablet Take 2 tablets by mouth every 6 (six) hours as needed for headache.   Yes Historical Provider, MD  OVER THE COUNTER MEDICATION Take 1 packet by mouth every 6 (six) hours as needed (headache). BC Powder   Yes Historical Provider, MD  ondansetron (ZOFRAN) 4 MG tablet Take 1 tablet (4 mg total) by mouth every 6 (six) hours. 01/06/14   Louann Sjogren, PA-C   Triage Vitals: BP 145/67  Pulse 65  Temp(Src) 98.4 F (36.9 C) (Oral)  Resp 16  SpO2 100% Physical Exam  Nursing note and vitals reviewed. Constitutional: He appears well-developed and well-nourished. No distress.  HENT:  Head: Normocephalic and atraumatic.  Mouth/Throat: Oropharynx is clear and moist.  Eyes: Conjunctivae and EOM are normal. Pupils are equal, round, and reactive to light. Right eye exhibits no discharge. Left eye exhibits no discharge.  Neck: Normal range of motion. Neck supple.  No nuchal rigidity  Cardiovascular: Normal rate and regular rhythm.   Pulmonary/Chest: Effort normal and breath sounds normal. No respiratory distress. He has no wheezes.  Abdominal: Soft. Bowel sounds are normal. He exhibits no distension. There is no tenderness.  Neurological: He is alert. No cranial nerve deficit. Coordination normal.  Strength 5/5 in upper and lower extremities. Sensation intact. Intact rapid alternating movements, finger to nose, and heel to shin. Negative Romberg. Normal gait.   Skin: Skin is warm  and dry. He is not diaphoretic.    ED Course  Procedures (including critical care time) DIAGNOSTIC STUDIES: Oxygen Saturation is 100% on room air, normal by my interpretation.    COORDINATION OF CARE: 1:08 PM-Discussed treatment plan which includes headache cocktail and f/u with a PCP with pt at bedside and pt agreed to plan.   Labs  Review Labs Reviewed - No data to display  Imaging Review No results found.   EKG Interpretation None     Meds given in ED:  Medications  diphenhydrAMINE (BENADRYL) capsule 50 mg (50 mg Oral Given 01/06/14 1318)  ibuprofen (ADVIL,MOTRIN) tablet 800 mg (800 mg Oral Given 01/06/14 1318)  metoCLOPramide (REGLAN) tablet 10 mg (10 mg Oral Given 01/06/14 1322)    Discharge Medication List as of 01/06/2014  2:28 PM    START taking these medications   Details  ondansetron (ZOFRAN) 4 MG tablet Take 1 tablet (4 mg total) by mouth every 6 (six) hours., Starting 01/06/2014, Until Discontinued, Print          MDM   Final diagnoses:  Headache on top of head   Pt HA treated and improved while in ED.  Presentation is like pt's typical HA, gradual in onset, not maximal in onset, and not worse of life. No visual or speech changes, and no weakness. Pt is afebrile with no focal neuro deficits or nuchal rigidity. I doubt SAH, ICH, meningits or temporal arteritis. Pt is to establish care follow up with PCP to discuss prophylactic medication. Patient given resources and phone number of the wellness clinic. Pt verbalizes understanding and is agreeable with plan to dc.   Discussed return precautions with patient. Discussed all results and patient verbalizes understanding and agrees with plan.  I personally performed the services described in this documentation, which was scribed in my presence. The recorded information has been reviewed and is accurate.   Louann SjogrenVictoria L [place], PA-C 01/06/14 2132

## 2014-01-06 NOTE — Discharge Instructions (Signed)
Return to the emergency room with worsening of symptoms, new symptoms or with symptoms that are concerning, especially severe worsening of headache, visual or speech changes, weakness in face, arms or legs. Follow up with the wellness clinic. Call number above to make an appointment as soon as possible to discuss possibility of prophylactic medications. Continue to take benadryl, ibuprofen, zofran as needed.

## 2014-01-06 NOTE — ED Notes (Signed)
Pt reports headache pain on top of head 10 days. Reports intermittent nausea. Pain unresponsive to OTC meds

## 2014-01-07 NOTE — ED Provider Notes (Signed)
Medical screening examination/treatment/procedure(s) were performed by non-physician practitioner and as supervising physician I was immediately available for consultation/collaboration.   EKG Interpretation None        Robinette Esters F Dawnelle Warman, MD 01/07/14 0639 

## 2014-06-29 ENCOUNTER — Emergency Department (HOSPITAL_COMMUNITY)
Admission: EM | Admit: 2014-06-29 | Discharge: 2014-06-29 | Disposition: A | Discharge status: Home / Self Care | Payer: Self-pay | Attending: Emergency Medicine | Admitting: Emergency Medicine

## 2014-06-29 ENCOUNTER — Encounter (HOSPITAL_COMMUNITY): Payer: Self-pay | Admitting: Emergency Medicine

## 2014-06-29 DIAGNOSIS — Z720 Tobacco use: Secondary | ICD-10-CM | POA: Insufficient documentation

## 2014-06-29 DIAGNOSIS — R110 Nausea: Secondary | ICD-10-CM | POA: Insufficient documentation

## 2014-06-29 DIAGNOSIS — Z79899 Other long term (current) drug therapy: Secondary | ICD-10-CM | POA: Insufficient documentation

## 2014-06-29 DIAGNOSIS — K029 Dental caries, unspecified: Secondary | ICD-10-CM | POA: Insufficient documentation

## 2014-06-29 DIAGNOSIS — Z792 Long term (current) use of antibiotics: Secondary | ICD-10-CM | POA: Insufficient documentation

## 2014-06-29 MED ORDER — IBUPROFEN 600 MG PO TABS: 600.0000 mg | ORAL_TABLET | Freq: Four times a day (QID) | ORAL | Status: DC | PRN

## 2014-06-29 MED ORDER — TRAMADOL HCL 50 MG PO TABS: 50.0000 mg | ORAL_TABLET | Freq: Four times a day (QID) | ORAL | Status: DC | PRN

## 2014-06-29 MED ORDER — AMOXICILLIN 500 MG PO CAPS: 500.0000 mg | ORAL_CAPSULE | Freq: Three times a day (TID) | ORAL | Status: DC

## 2014-06-29 NOTE — Discharge Instructions (Signed)
Dental Care and Dentist Visits Dental care supports good overall health. Regular dental visits can also help you avoid dental pain, bleeding, infection, and other more serious health problems in the future. It is important to keep the mouth healthy because diseases in the teeth, gums, and other oral tissues can spread to other areas of the body. Some problems, such as diabetes, heart disease, and pre-term labor have been associated with poor oral health.  See your dentist every 6 months. If you experience emergency problems such as a toothache or broken tooth, go to the dentist right away. If you see your dentist regularly, you may catch problems early. It is easier to be treated for problems in the early stages.  WHAT TO EXPECT AT A DENTIST VISIT  Your dentist will look for many common oral health problems and recommend proper treatment. At your regular dental visit, you can expect:  Gentle cleaning of the teeth and gums. This includes scraping and polishing. This helps to remove the sticky substance around the teeth and gums (plaque). Plaque forms in the mouth shortly after eating. Over time, plaque hardens on the teeth as tartar. If tartar is not removed regularly, it can cause problems. Cleaning also helps remove stains.  Periodic X-rays. These pictures of the teeth and supporting bone will help your dentist assess the health of your teeth.  Periodic fluoride treatments. Fluoride is a natural mineral shown to help strengthen teeth. Fluoride treatmentinvolves applying a fluoride gel or varnish to the teeth. It is most commonly done in children.  Examination of the mouth, tongue, jaws, teeth, and gums to look for any oral health problems, such as:  Cavities (dental caries). This is decay on the tooth caused by plaque, sugar, and acid in the mouth. It is best to catch a cavity when it is small.  Inflammation of the gums caused by plaque buildup (gingivitis).  Problems with the mouth or malformed  or misaligned teeth.  Oral cancer or other diseases of the soft tissues or jaws. KEEP YOUR TEETH AND GUMS HEALTHY For healthy teeth and gums, follow these general guidelines as well as your dentist's specific advice:  Have your teeth professionally cleaned at the dentist every 6 months.  Brush twice daily with a fluoride toothpaste.  Floss your teeth daily.  Ask your dentist if you need fluoride supplements, treatments, or fluoride toothpaste.  Eat a healthy diet. Reduce foods and drinks with added sugar.  Avoid smoking. TREATMENT FOR ORAL HEALTH PROBLEMS If you have oral health problems, treatment varies depending on the conditions present in your teeth and gums.  Your caregiver will most likely recommend good oral hygiene at each visit.  For cavities, gingivitis, or other oral health disease, your caregiver will perform a procedure to treat the problem. This is typically done at a separate appointment. Sometimes your caregiver will refer you to another dental specialist for specific tooth problems or for surgery. SEEK IMMEDIATE DENTAL CARE IF:  You have pain, bleeding, or soreness in the gum, tooth, jaw, or mouth area.  A permanent tooth becomes loose or separated from the gum socket.  You experience a blow or injury to the mouth or jaw area. Document Released: 11/30/2010 Document Revised: 06/12/2011 Document Reviewed: 11/30/2010 [facility] Patient Information 2015 [place], [place]. This information is not intended to replace advice given to you by your health care provider. Make sure you discuss any questions you have with your health care provider.  Dental Pain Toothache is pain in or around  a tooth. It may get worse with chewing or with cold or heat.  HOME CARE  Your dentist may use a numbing medicine during treatment. If so, you may need to avoid eating until the medicine wears off. Ask your dentist about this.  Only take medicine as told by your dentist or  doctor.  Avoid chewing food near the painful tooth until after all treatment is done. Ask your dentist about this. GET HELP RIGHT AWAY IF:   The problem gets worse or new problems appear.  You have a fever.  There is redness and puffiness (swelling) of the face, jaw, or neck.  You cannot open your mouth.  There is pain in the jaw.  There is very bad pain that is not helped by medicine. MAKE SURE YOU:   Understand these instructions.  Will watch your condition.  Will get help right away if you are not doing well or get worse. Document Released: 09/06/2007 Document Revised: 06/12/2011 Document Reviewed: 09/06/2007 [facility] Patient Information 2015 [place], [place]. This information is not intended to replace advice given to you by your health care provider. Make sure you discuss any questions you have with your health care provider.  Patients with Medicaid: [facility] [phone] [address], [phone] [address] [phone]  If unable to pay, or uninsured, contact [facility] [phone]) or [facility] [phone] in [place], [phone] in [facility]) to become qualified for the adult dental clinic  Other Low-Cost Community Dental Services: [facility]- [address]asha Bence [place], [place], [address]    [phone], Ext. [phone]    2nd and 4th Thursday of the month at 6:30am    10 clients each day by appointment, can sometimes see walk-in patients if someone does not show for an appointment [facility]- [address] [place], [place], [address]    [phone] [facility] [address], [place], [place], [address]    [phone]  [facility] Health Department- [phone] [facility] Health Department- [phone] [facility] Department- [phone]  Dental Care and Dentist Visits Dental care supports good overall health. Regular dental visits can also help you avoid dental pain, bleeding,  infection, and other more serious health problems in the future. It is important to keep the mouth healthy because diseases in the teeth, gums, and other oral tissues can spread to other areas of the body. Some problems, such as diabetes, heart disease, and pre-term labor have been associated with poor oral health.  See your dentist every 6 months. If you experience emergency problems such as a toothache or broken tooth, go to the dentist right away. If you see your dentist regularly, you may catch problems early. It is easier to be treated for problems in the early stages.  WHAT TO EXPECT AT A DENTIST VISIT  Your dentist will look for many common oral health problems and recommend proper treatment. At your regular dental visit, you can expect:  Gentle cleaning of the teeth and gums. This includes scraping and polishing. This helps to remove the sticky substance around the teeth and gums (plaque). Plaque forms in the mouth shortly after eating. Over time, plaque hardens on the teeth as tartar. If tartar is not removed regularly, it can cause problems. Cleaning also helps remove stains.  Periodic X-rays. These pictures of the teeth and supporting bone will help your dentist assess the health of your teeth.  Periodic fluoride treatments. Fluoride is a natural mineral shown  to help strengthen teeth. Fluoride treatmentinvolves applying a fluoride gel or varnish to the teeth. It is most commonly done in children.  Examination of the mouth, tongue, jaws, teeth, and gums to look for any oral health problems, such as:  Cavities (dental caries). This is decay on the tooth caused by plaque, sugar, and acid in the mouth. It is best to catch a cavity when it is small.  Inflammation of the gums caused by plaque buildup (gingivitis).  Problems with the mouth or malformed or misaligned teeth.  Oral cancer or other diseases of the soft tissues or jaws. KEEP YOUR TEETH AND GUMS HEALTHY For healthy teeth and  gums, follow these general guidelines as well as your dentist's specific advice:  Have your teeth professionally cleaned at the dentist every 6 months.  Brush twice daily with a fluoride toothpaste.  Floss your teeth daily.  Ask your dentist if you need fluoride supplements, treatments, or fluoride toothpaste.  Eat a healthy diet. Reduce foods and drinks with added sugar.  Avoid smoking. TREATMENT FOR ORAL HEALTH PROBLEMS If you have oral health problems, treatment varies depending on the conditions present in your teeth and gums.  Your caregiver will most likely recommend good oral hygiene at each visit.  For cavities, gingivitis, or other oral health disease, your caregiver will perform a procedure to treat the problem. This is typically done at a separate appointment. Sometimes your caregiver will refer you to another dental specialist for specific tooth problems or for surgery. SEEK IMMEDIATE DENTAL CARE IF:  You have pain, bleeding, or soreness in the gum, tooth, jaw, or mouth area.  A permanent tooth becomes loose or separated from the gum socket.  You experience a blow or injury to the mouth or jaw area. Document Released: 11/30/2010 Document Revised: 06/12/2011 Document Reviewed: 11/30/2010 [facility] Patient Information 2015 [place], [place]. This information is not intended to replace advice given to you by your health care provider. Make sure you discuss any questions you have with your health care provider.

## 2014-06-29 NOTE — ED Provider Notes (Signed)
CSN: [id number]     Arrival date & time 06/29/14  1352 History  This chart was scribed for Junius FinnerErin O'Malley, PA-C, working with Blake DivineJohn Wofford, MD by Jolene Provostobert Halas, ED Scribe. This patient was seen in room WTR7/WTR7 and the patient's care was started at 3:29 PM.    Chief Complaint  Patient presents with  . Dental Pain   Patient is a 34 y.o. male presenting with tooth pain. The history is provided by the patient. No language interpreter was used.  Dental Pain Associated symptoms: oral lesions   Associated symptoms: no fever     HPI Comments: David AhmadiJason M Mcneil is a 34 y.o. male who presents to the Emergency Department complaining of right-sided lower dental pain for the last three days. Pt states he has very bad teeth. Pt endorses associated nausea. Pt denies fevers, chills, or vomiting. Pt states he does not have a dentist, or healthcare. Pt states his pain is 9/10.  No relief with OTC pain medications.    Past Medical History  Diagnosis Date  . Dental caries   . Migraine    Past Surgical History  Procedure Laterality Date  . Appendectomy     No family history on file. History  Substance Use Topics  . Smoking status: Current Every Day Smoker -- 1.00 packs/day    Types: Cigarettes  . Smokeless tobacco: Never Used  . Alcohol Use: No    Review of Systems  Constitutional: Negative for fever and chills.  HENT: Positive for dental problem and mouth sores.   Gastrointestinal: Positive for nausea. Negative for vomiting.  All other systems reviewed and are negative.   Allergies  Promethazine hcl  Home Medications   Prior to Admission medications   Medication Sig Start Date End Date Taking? Authorizing Provider  Aspirin-Salicylamide-Caffeine (BC HEADACHE POWDER PO) Take 1 packet by mouth every 6 (six) hours as needed (for pain).   Yes Historical Provider, MD  amoxicillin (AMOXIL) 500 MG capsule Take 1 capsule (500 mg total) by mouth 3 (three) times daily. 06/29/14   Junius FinnerErin O'Malley, PA-C   ibuprofen (ADVIL,MOTRIN) 600 MG tablet Take 1 tablet (600 mg total) by mouth every 6 (six) hours as needed. 06/29/14   Junius FinnerErin O'Malley, PA-C  ondansetron (ZOFRAN) 4 MG tablet Take 1 tablet (4 mg total) by mouth every 6 (six) hours. Patient not taking: Reported on 06/29/2014 01/06/14   Oswaldo ConroyVictoria Creech, PA-C  traMADol (ULTRAM) 50 MG tablet Take 1 tablet (50 mg total) by mouth every 6 (six) hours as needed. 06/29/14   Junius FinnerErin O'Malley, PA-C   BP 119/63 mmHg  Pulse 76  Temp(Src) 98.7 F (37.1 C) (Oral)  Resp 14  SpO2 96% Physical Exam  Constitutional: He is oriented to person, place, and time. He appears well-developed and well-nourished. No distress.  HENT:  Head: Normocephalic and atraumatic.  Diffuse dental decay w/o obvious gingival abscess. No airway obstruction.  Eyes: Pupils are equal, round, and reactive to light.  Neck: Normal range of motion. Neck supple.  Cardiovascular: Normal rate.   Pulmonary/Chest: Effort normal. No respiratory distress.  Musculoskeletal: Normal range of motion.  Neurological: He is alert and oriented to person, place, and time. Coordination normal.  Skin: Skin is warm and dry. He is not diaphoretic.  Psychiatric: He has a normal mood and affect. His behavior is normal.  Nursing note and vitals reviewed.   ED Course  Procedures  DIAGNOSTIC STUDIES: Oxygen Saturation is 97% on RA, normal by my interpretation.    COORDINATION OF  CARE: 3:31 PM Discussed treatment plan with pt at bedside and pt agreed to plan.  Labs Review Labs Reviewed - No data to display  Imaging Review No results found.   EKG Interpretation None      MDM   Final diagnoses:  Dental decay  Pain due to dental caries   Pt with diffuse dental decay c/o dental pain. No gingival abscess requiring I&D at this time. Will tx pt with amoxicillin, ibuprofen and tramadol. Advised to f/u with Dr. Lucky Cowboy, DDS, for further evaluation and treatment of dental pain. Return precautions provided. Pt  verbalized understanding and agreement with tx plan.   I personally performed the services described in this documentation, which was scribed in my presence. The recorded information has been reviewed and is accurate.    Junius Finner, PA-C 06/30/14 1535  Blake Divine, MD 07/01/14 [phone]

## 2014-06-29 NOTE — ED Notes (Signed)
Pt c/o dental pain ans swelling to lower right side, states "I have really bad teeth."

## 2014-10-03 ENCOUNTER — Encounter (HOSPITAL_COMMUNITY): Payer: Self-pay | Admitting: Emergency Medicine

## 2014-10-03 ENCOUNTER — Emergency Department (HOSPITAL_COMMUNITY): Payer: Self-pay

## 2014-10-03 ENCOUNTER — Emergency Department (HOSPITAL_COMMUNITY)
Admission: EM | Admit: 2014-10-03 | Discharge: 2014-10-03 | Disposition: A | Discharge status: Home / Self Care | Payer: Self-pay | Attending: Emergency Medicine | Admitting: Emergency Medicine

## 2014-10-03 DIAGNOSIS — Y9389 Activity, other specified: Secondary | ICD-10-CM | POA: Insufficient documentation

## 2014-10-03 DIAGNOSIS — Y9289 Other specified places as the place of occurrence of the external cause: Secondary | ICD-10-CM | POA: Insufficient documentation

## 2014-10-03 DIAGNOSIS — Y998 Other external cause status: Secondary | ICD-10-CM | POA: Insufficient documentation

## 2014-10-03 DIAGNOSIS — Z792 Long term (current) use of antibiotics: Secondary | ICD-10-CM | POA: Insufficient documentation

## 2014-10-03 DIAGNOSIS — S9001XA Contusion of right ankle, initial encounter: Secondary | ICD-10-CM | POA: Insufficient documentation

## 2014-10-03 DIAGNOSIS — Z720 Tobacco use: Secondary | ICD-10-CM | POA: Insufficient documentation

## 2014-10-03 DIAGNOSIS — Z8719 Personal history of other diseases of the digestive system: Secondary | ICD-10-CM | POA: Insufficient documentation

## 2014-10-03 DIAGNOSIS — Z79899 Other long term (current) drug therapy: Secondary | ICD-10-CM | POA: Insufficient documentation

## 2014-10-03 DIAGNOSIS — W01198A Fall on same level from slipping, tripping and stumbling with subsequent striking against other object, initial encounter: Secondary | ICD-10-CM | POA: Insufficient documentation

## 2014-10-03 DIAGNOSIS — G43909 Migraine, unspecified, not intractable, without status migrainosus: Secondary | ICD-10-CM | POA: Insufficient documentation

## 2014-10-03 MED ORDER — HYDROCODONE-ACETAMINOPHEN 5-325 MG PO TABS: 1.0000 | ORAL_TABLET | ORAL | Status: DC | PRN

## 2014-10-03 MED ORDER — IBUPROFEN 600 MG PO TABS: 600.0000 mg | ORAL_TABLET | Freq: Four times a day (QID) | ORAL | Status: DC | PRN

## 2014-10-03 MED ORDER — HYDROCODONE-ACETAMINOPHEN 5-325 MG PO TABS
1.0000 | ORAL_TABLET | Freq: Once | ORAL | Status: AC
  Administered 2014-10-03: 1 via ORAL
  Filled 2014-10-03: qty 1

## 2014-10-03 NOTE — ED Notes (Signed)
Pt verbalized understanding of no driving and to use caution within 4 hours of taking pain meds due to meds cause drowsiness 
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
## 2014-10-03 NOTE — Discharge Instructions (Signed)
Contusion °A contusion is a deep bruise. Contusions are the result of an injury that caused bleeding under the skin. The contusion may turn blue, purple, or yellow. Minor injuries will give you a painless contusion, but more severe contusions may stay painful and swollen for a few weeks.  °CAUSES  °A contusion is usually caused by a blow, trauma, or direct force to an area of the body. °SYMPTOMS  °· Swelling and redness of the injured area. °· Bruising of the injured area. °· Tenderness and soreness of the injured area. °· Pain. °DIAGNOSIS  °The diagnosis can be made by taking a history and physical exam. An X-ray, CT scan, or MRI may be needed to determine if there were any associated injuries, such as fractures. °TREATMENT  °Specific treatment will depend on what area of the body was injured. In general, the best treatment for a contusion is resting, icing, elevating, and applying cold compresses to the injured area. Over-the-counter medicines may also be recommended for pain control. Ask your caregiver what the best treatment is for your contusion. °HOME CARE INSTRUCTIONS  °· Put ice on the injured area. °¨ Put ice in a plastic bag. °¨ Place a towel between your skin and the bag. °¨ Leave the ice on for 15-20 minutes, 3-4 times a day, or as directed by your health care provider. °· Only take over-the-counter or prescription medicines for pain, discomfort, or fever as directed by your caregiver. Your caregiver may recommend avoiding anti-inflammatory medicines (aspirin, ibuprofen, and naproxen) for 48 hours because these medicines may increase bruising. °· Rest the injured area. °· If possible, elevate the injured area to reduce swelling. °SEEK IMMEDIATE MEDICAL CARE IF:  °· You have increased bruising or swelling. °· You have pain that is getting worse. °· Your swelling or pain is not relieved with medicines. °MAKE SURE YOU:  °· Understand these instructions. °· Will watch your condition. °· Will get help right  away if you are not doing well or get worse. °Document Released: 12/28/2004 Document Revised: 03/25/2013 Document Reviewed: 01/23/2011 °Exit[employer]® Patient Information ©2015 [employer]. This information is not intended to replace advice given to you by your health care provider. Make sure you discuss any questions you have with your health care provider. ° °You may take the hydrocodone prescribed for pain relief.  This will make you drowsy - do not drive within 4 hours of taking this medication. ° ° °
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
## 2014-12-02 ENCOUNTER — Emergency Department (HOSPITAL_COMMUNITY)
Admission: EM | Admit: 2014-12-02 | Discharge: 2014-12-03 | Discharge status: Against Medical Advice | Payer: Self-pay | Attending: Emergency Medicine | Admitting: Emergency Medicine

## 2014-12-02 ENCOUNTER — Encounter (HOSPITAL_COMMUNITY): Payer: Self-pay | Admitting: Emergency Medicine

## 2014-12-02 DIAGNOSIS — W01198A Fall on same level from slipping, tripping and stumbling with subsequent striking against other object, initial encounter: Secondary | ICD-10-CM | POA: Insufficient documentation

## 2014-12-02 DIAGNOSIS — Y998 Other external cause status: Secondary | ICD-10-CM | POA: Insufficient documentation

## 2014-12-02 DIAGNOSIS — Y9289 Other specified places as the place of occurrence of the external cause: Secondary | ICD-10-CM | POA: Insufficient documentation

## 2014-12-02 DIAGNOSIS — S0990XA Unspecified injury of head, initial encounter: Secondary | ICD-10-CM | POA: Insufficient documentation

## 2014-12-02 DIAGNOSIS — Y9389 Activity, other specified: Secondary | ICD-10-CM | POA: Insufficient documentation

## 2014-12-02 DIAGNOSIS — Z720 Tobacco use: Secondary | ICD-10-CM | POA: Insufficient documentation

## 2014-12-02 DIAGNOSIS — S199XXA Unspecified injury of neck, initial encounter: Secondary | ICD-10-CM | POA: Insufficient documentation
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
## 2014-12-02 NOTE — ED Notes (Signed)
Pt has been taking OTC medication without relief

## 2014-12-12 ENCOUNTER — Emergency Department (HOSPITAL_COMMUNITY)
Admission: EM | Admit: 2014-12-12 | Discharge: 2014-12-12 | Disposition: A | Discharge status: Home / Self Care | Payer: No Typology Code available for payment source | Attending: Emergency Medicine | Admitting: Emergency Medicine

## 2014-12-12 ENCOUNTER — Encounter (HOSPITAL_COMMUNITY): Payer: Self-pay | Admitting: Emergency Medicine

## 2014-12-12 DIAGNOSIS — S46819A Strain of other muscles, fascia and tendons at shoulder and upper arm level, unspecified arm, initial encounter: Secondary | ICD-10-CM

## 2014-12-12 DIAGNOSIS — Y92410 Unspecified street and highway as the place of occurrence of the external cause: Secondary | ICD-10-CM | POA: Insufficient documentation

## 2014-12-12 DIAGNOSIS — G43909 Migraine, unspecified, not intractable, without status migrainosus: Secondary | ICD-10-CM | POA: Insufficient documentation

## 2014-12-12 DIAGNOSIS — Z720 Tobacco use: Secondary | ICD-10-CM | POA: Insufficient documentation

## 2014-12-12 DIAGNOSIS — Y9389 Activity, other specified: Secondary | ICD-10-CM | POA: Diagnosis not present

## 2014-12-12 DIAGNOSIS — Y998 Other external cause status: Secondary | ICD-10-CM | POA: Diagnosis not present

## 2014-12-12 DIAGNOSIS — S0990XA Unspecified injury of head, initial encounter: Secondary | ICD-10-CM | POA: Insufficient documentation

## 2014-12-12 DIAGNOSIS — S29012A Strain of muscle and tendon of back wall of thorax, initial encounter: Secondary | ICD-10-CM | POA: Diagnosis not present

## 2014-12-12 DIAGNOSIS — K089 Disorder of teeth and supporting structures, unspecified: Secondary | ICD-10-CM | POA: Insufficient documentation

## 2014-12-12 DIAGNOSIS — S199XXA Unspecified injury of neck, initial encounter: Secondary | ICD-10-CM | POA: Diagnosis present

## 2014-12-12 MED ORDER — METHOCARBAMOL 500 MG PO TABS: 500.0000 mg | ORAL_TABLET | Freq: Three times a day (TID) | ORAL | Status: DC

## 2014-12-12 MED ORDER — METHOCARBAMOL 500 MG PO TABS
1000.0000 mg | ORAL_TABLET | Freq: Once | ORAL | Status: AC
  Administered 2014-12-12: 1000 mg via ORAL
  Filled 2014-12-12: qty 2

## 2014-12-12 MED ORDER — ACETAMINOPHEN-CODEINE #3 300-30 MG PO TABS
2.0000 | ORAL_TABLET | Freq: Once | ORAL | Status: AC
  Administered 2014-12-12: 2 via ORAL
  Filled 2014-12-12: qty 2

## 2014-12-12 MED ORDER — IBUPROFEN 800 MG PO TABS: 800.0000 mg | ORAL_TABLET | Freq: Three times a day (TID) | ORAL | Status: DC
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
## 2015-03-03 ENCOUNTER — Emergency Department (HOSPITAL_COMMUNITY)
Admission: EM | Admit: 2015-03-03 | Discharge: 2015-03-03 | Disposition: A | Discharge status: Home / Self Care | Payer: Self-pay | Attending: Emergency Medicine | Admitting: Emergency Medicine

## 2015-03-03 ENCOUNTER — Emergency Department (HOSPITAL_COMMUNITY): Payer: Self-pay

## 2015-03-03 ENCOUNTER — Encounter (HOSPITAL_COMMUNITY): Payer: Self-pay | Admitting: Emergency Medicine

## 2015-03-03 DIAGNOSIS — J209 Acute bronchitis, unspecified: Secondary | ICD-10-CM | POA: Insufficient documentation

## 2015-03-03 DIAGNOSIS — F17210 Nicotine dependence, cigarettes, uncomplicated: Secondary | ICD-10-CM | POA: Insufficient documentation

## 2015-03-03 DIAGNOSIS — Z8719 Personal history of other diseases of the digestive system: Secondary | ICD-10-CM | POA: Insufficient documentation

## 2015-03-03 DIAGNOSIS — J40 Bronchitis, not specified as acute or chronic: Secondary | ICD-10-CM

## 2015-03-03 DIAGNOSIS — Z8679 Personal history of other diseases of the circulatory system: Secondary | ICD-10-CM | POA: Insufficient documentation

## 2015-03-03 MED ORDER — AZITHROMYCIN 250 MG PO TABS: 250.0000 mg | ORAL_TABLET | Freq: Every day | ORAL | Status: DC

## 2015-03-03 MED ORDER — ALBUTEROL SULFATE HFA 108 (90 BASE) MCG/ACT IN AERS: 1.0000 | INHALATION_SPRAY | Freq: Four times a day (QID) | RESPIRATORY_TRACT | Status: AC | PRN

## 2015-03-03 NOTE — ED Provider Notes (Signed)
CSN: [id number]     Arrival date & time 03/03/15  1156 History   First MD Initiated Contact with Patient 03/03/15 1232     Chief Complaint  Patient presents with  . Cough     (Consider location/radiation/quality/duration/timing/severity/associated sxs/prior Treatment) Patient is a 34 y.o. male presenting with cough. The history is provided by the patient. No language interpreter was used.  Cough Cough characteristics:  Productive Sputum characteristics:  Green Severity:  Moderate Duration:  2 weeks Timing:  Constant Progression:  Worsening Smoker: no   Context: upper respiratory infection   Relieved by:  Nothing Worsened by:  Nothing tried Ineffective treatments:  None tried Associated symptoms: shortness of breath and sinus congestion     Past Medical History  Diagnosis Date  . Dental caries   . Migraine    Past Surgical History  Procedure Laterality Date  . Appendectomy     History reviewed. No pertinent family history. Social History  Substance Use Topics  . Smoking status: Current Every Day Smoker -- 1.00 packs/day    Types: Cigarettes  . Smokeless tobacco: Never Used  . Alcohol Use: No     Comment: stopped     Review of Systems  Respiratory: Positive for cough and shortness of breath.   All other systems reviewed and are negative.     Allergies  Promethazine hcl  Home Medications   Prior to Admission medications   Medication Sig Start Date End Date Taking? Authorizing Provider  guaiFENesin (MUCINEX) 600 MG 12 hr tablet Take 600 mg by mouth daily as needed for cough.   Yes Historical Provider, MD  albuterol (PROVENTIL HFA;VENTOLIN HFA) 108 (90 BASE) MCG/ACT inhaler Inhale 1-2 puffs into the lungs every 6 (six) hours as needed for wheezing or shortness of breath. 03/03/15   Elson Areas, PA-C  azithromycin (ZITHROMAX) 250 MG tablet Take 1 tablet (250 mg total) by mouth daily. Take first 2 tablets together, then 1 every day until finished. 03/03/15    Elson Areas, PA-C   BP 129/68 mmHg  Pulse 81  Temp(Src) 98 F (36.7 C) (Oral)  Resp 18  Ht  (1.676 m)  Wt 49.896 kg  BMI 17.76 kg/m2  SpO2 100% Physical Exam  Constitutional: He is oriented to person, place, and time. He appears well-developed and well-nourished.  HENT:  Head: Normocephalic.  Eyes: EOM are normal.  Neck: Normal range of motion.  Cardiovascular: Normal rate and normal heart sounds.   Pulmonary/Chest: Effort normal.  rhonchi  Abdominal: Soft. He exhibits no distension.  Musculoskeletal: Normal range of motion.  Neurological: He is alert and oriented to person, place, and time.  Skin: There is erythema.  Psychiatric: He has a normal mood and affect.  Nursing note and vitals reviewed.   ED Course  Procedures (including critical care time) Labs Review Labs Reviewed - No data to display  Imaging Review Dg Chest 2 View  03/03/2015  CLINICAL DATA:  Cough and congestion for 2-3 days. EXAM: CHEST  2 VIEW COMPARISON:  Chest x-ray dated 09/24/2013. FINDINGS: The heart size and mediastinal contours are within normal limits. Both lungs are clear. The visualized skeletal structures are unremarkable. IMPRESSION: No active cardiopulmonary disease.  No evidence of pneumonia. Electronically Signed   By: Bary Richard M.D.   On: 03/03/2015 12:32   I have personally reviewed and evaluated these images and lab results as part of my medical decision-making.   EKG Interpretation None      MDM  Final diagnoses:  Bronchitis     Pt given rx for zithromax and albuterol.    Lonia SkinnerLeslie K CoudersportSofia, PA-C 03/03/15 1527  Donnetta HutchingBrian Cook, MD 03/04/15 [phone]

## 2015-03-03 NOTE — ED Notes (Signed)
Pt stated that he has been having cough and congestion for about 2-3 weeks- Been using OTC meds, but not improving, now hurts when he coughs up sputum that is green in color- Denies fevers- Congestion primarily in his chest - little nasal congestion

## 2015-03-03 NOTE — Discharge Instructions (Signed)

## 2016-02-24 ENCOUNTER — Emergency Department (HOSPITAL_COMMUNITY)
Admission: EM | Admit: 2016-02-24 | Discharge: 2016-02-24 | Disposition: A | Discharge status: Home / Self Care | Payer: Self-pay | Attending: Emergency Medicine | Admitting: Emergency Medicine

## 2016-02-24 ENCOUNTER — Encounter (HOSPITAL_COMMUNITY): Payer: Self-pay | Admitting: Emergency Medicine

## 2016-02-24 DIAGNOSIS — Z5321 Procedure and treatment not carried out due to patient leaving prior to being seen by health care provider: Secondary | ICD-10-CM | POA: Insufficient documentation

## 2016-02-24 DIAGNOSIS — F17210 Nicotine dependence, cigarettes, uncomplicated: Secondary | ICD-10-CM | POA: Insufficient documentation

## 2016-02-24 DIAGNOSIS — R109 Unspecified abdominal pain: Secondary | ICD-10-CM | POA: Insufficient documentation

## 2016-02-24 NOTE — ED Notes (Signed)
Writer observed pt leaving the ED, pt sts he has to go, and that he was going to check back in later. Other staff members tried to convince him to stay but he stated he has to go.

## 2016-02-24 NOTE — ED Notes (Addendum)
PT was seen walking in the hallway, told staff that he has to go. Staff asked pt to stay and he said that he will check out and check back in.

## 2016-02-24 NOTE — ED Triage Notes (Addendum)
Pt from home with complaints of left middle abdominal pain. 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 Pt states that he can always push it back into place. Pt states that he was laying on his bed this evening and had that sensation, and was able to correct it, but he is still having 6/10 abdominal pain. Pt states the pain feels like a sharp stabbing pain. Pt denies nausea, emesis, or diarrhea. Pt states this episode all began today. Pt's LBM was this morning and was normal

## 2016-02-24 NOTE — ED Notes (Signed)
Pt states he would like to leave because he "needs to meet someone". Pt verbalized understanding that he is leaving against medical advice

## 2017-01-23 IMAGING — DX DG ANKLE COMPLETE 3+V*R*
3 series · 3 of 3 positions shown · non-contrast
Comparison: None.

CLINICAL DATA: Generalized right ankle pain with injury.

EXAM:
RIGHT ANKLE - COMPLETE 3+ VIEW

[ankle ap]
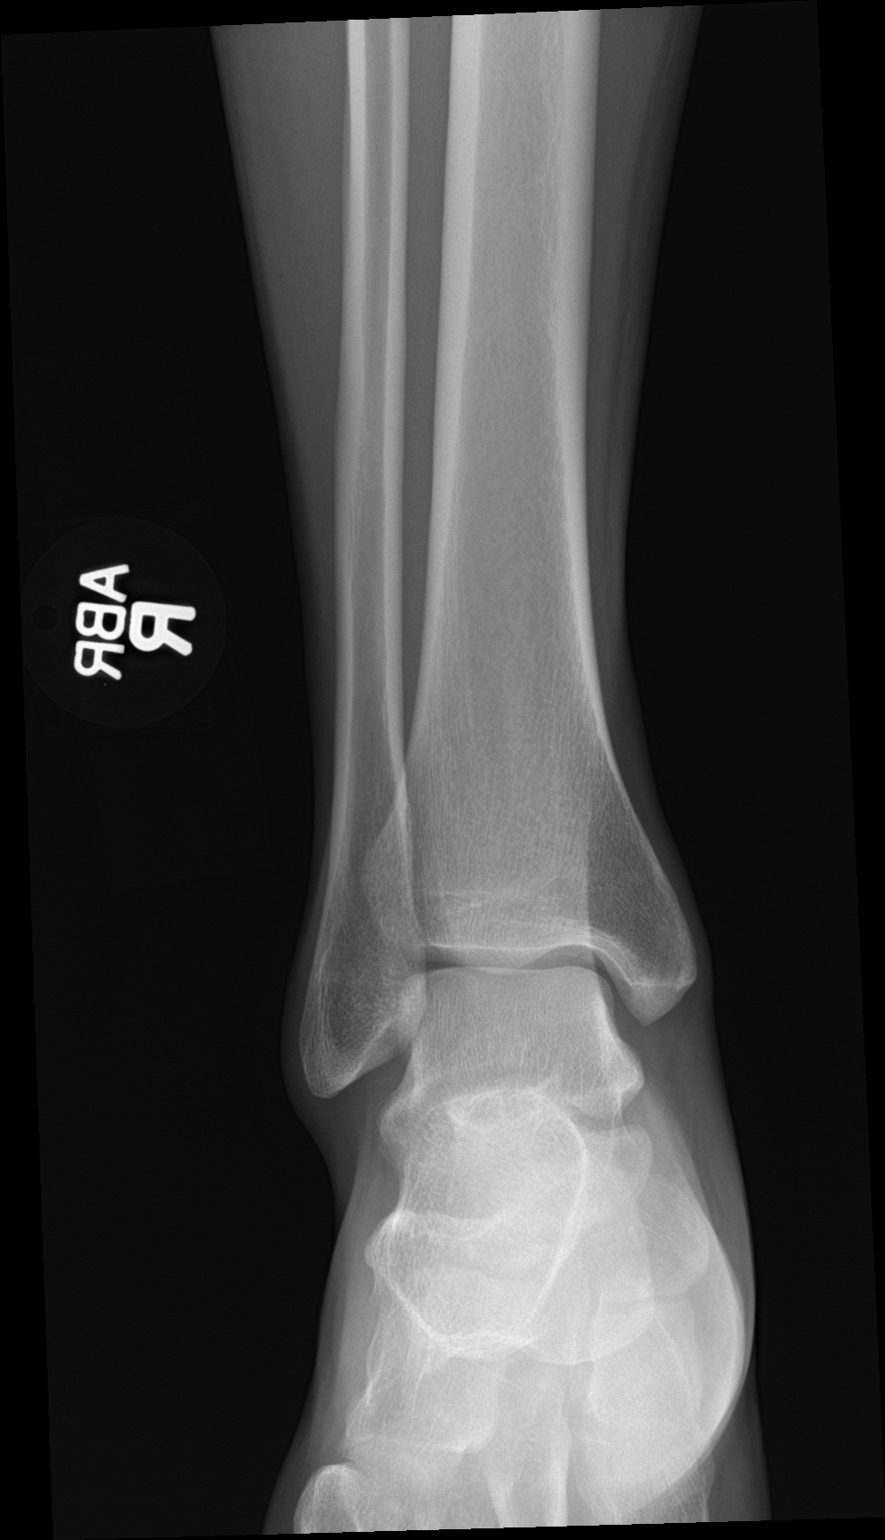

[ankle obl]
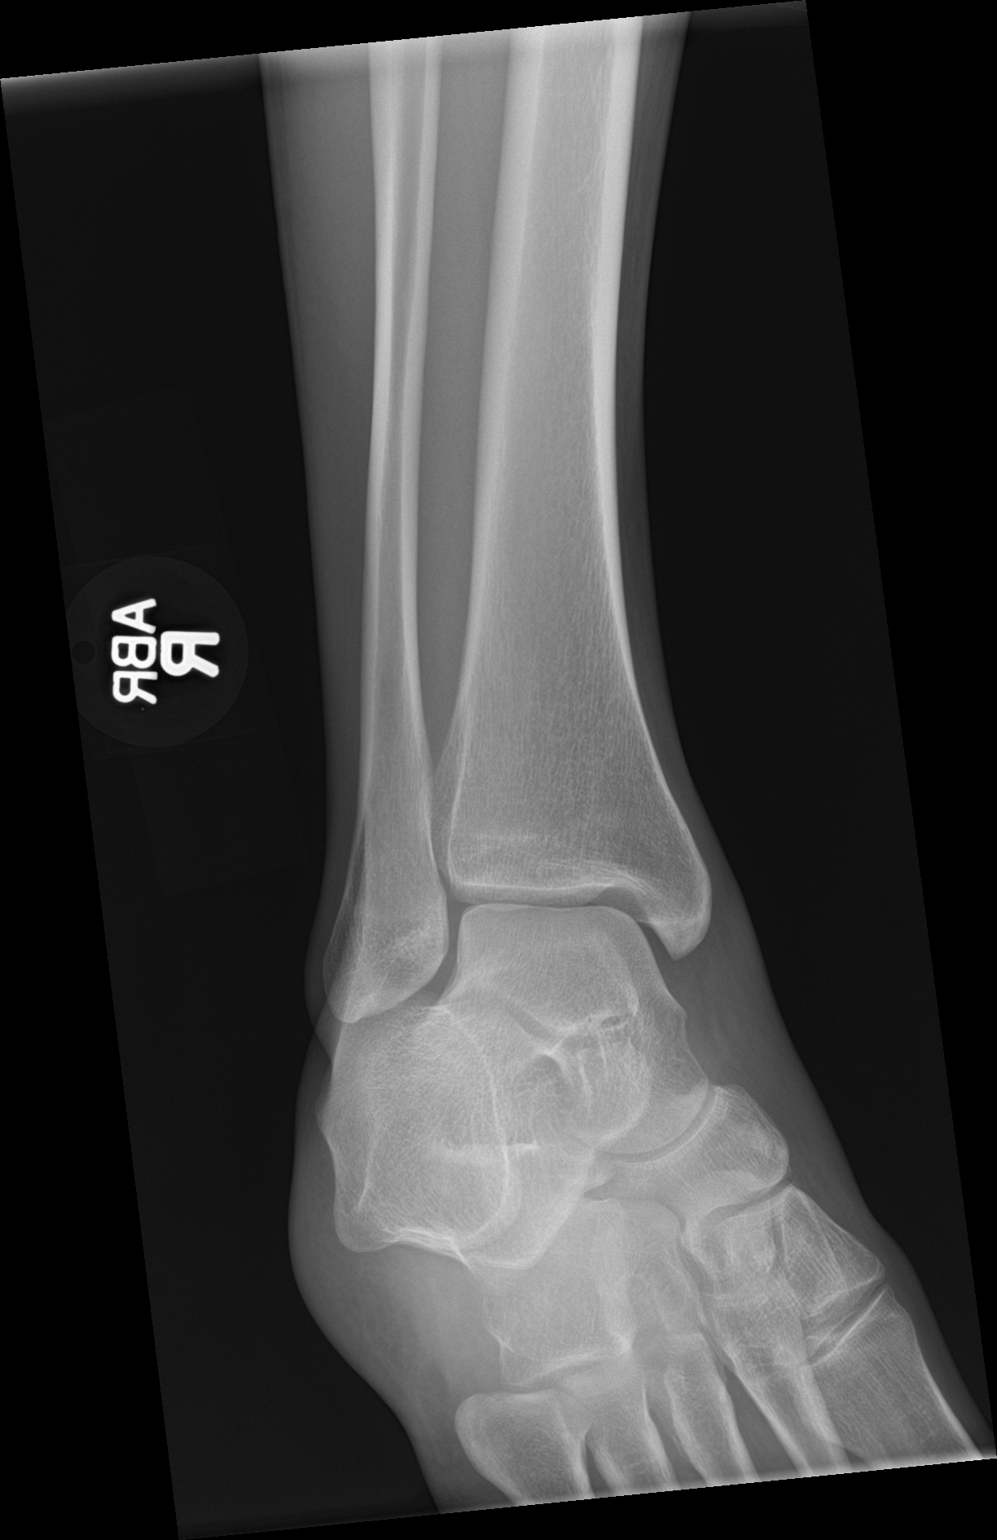

[ankle lat]
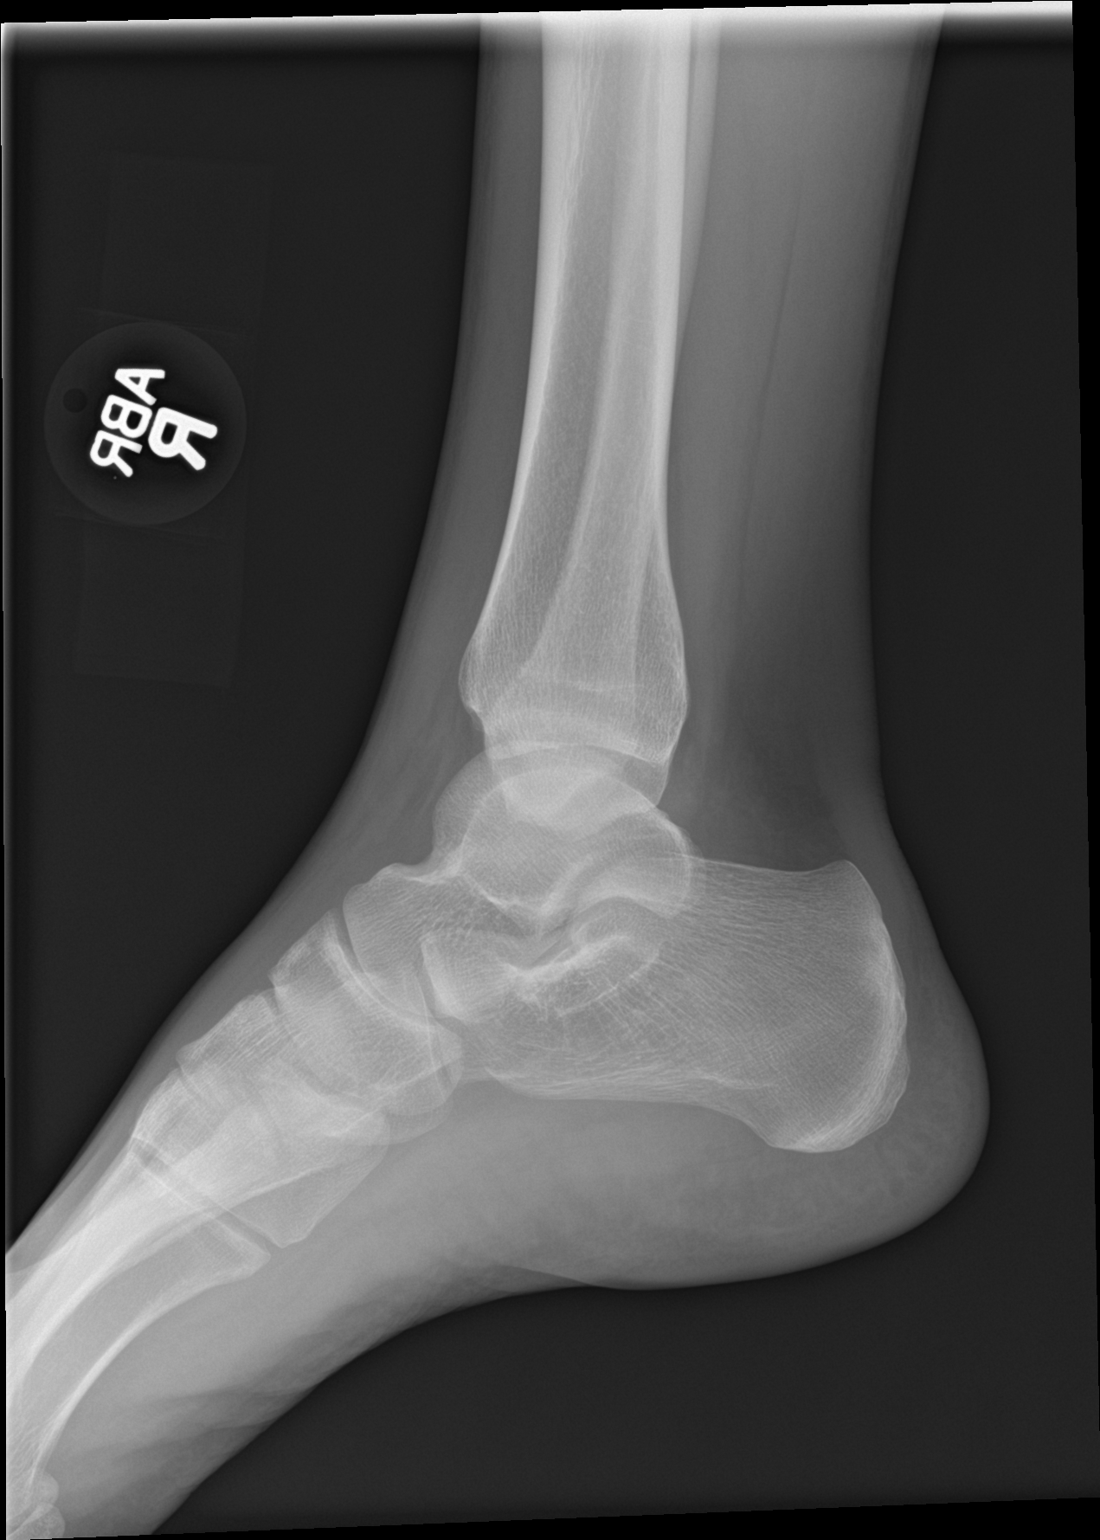

[3 of 3 positions shown; findings below may reference images not displayed]

FINDINGS: There is no evidence of fracture, dislocation, or joint effusion.
There is no evidence of arthropathy or other focal bone abnormality.
Soft tissues are unremarkable.
IMPRESSION: Negative.
